# Patient Record
Sex: Female | Born: 1945 | Race: Black or African American | Hispanic: No | Marital: Married | Smoking: Never smoker
Health system: Southern US, Community
[De-identification: ages and names within clinical notes are randomized; demographics above are authoritative.]

## PROBLEM LIST (undated history)

## (undated) HISTORY — PX: BREAST EXCISIONAL BIOPSY: SUR124

---

## 1998-08-17 ENCOUNTER — Other Ambulatory Visit: Admission: RE | Admit: 1998-08-17 | Discharge: 1998-08-17 | Payer: Self-pay | Admitting: *Deleted

## 1998-09-28 ENCOUNTER — Encounter: Payer: Self-pay | Admitting: *Deleted

## 1998-09-28 ENCOUNTER — Ambulatory Visit (HOSPITAL_COMMUNITY): Admission: RE | Admit: 1998-09-28 | Discharge: 1998-09-28 | Payer: Self-pay | Admitting: *Deleted

## 1999-08-24 ENCOUNTER — Other Ambulatory Visit: Admission: RE | Admit: 1999-08-24 | Discharge: 1999-08-24 | Payer: Self-pay | Admitting: *Deleted

## 1999-10-04 ENCOUNTER — Ambulatory Visit (HOSPITAL_COMMUNITY): Admission: RE | Admit: 1999-10-04 | Discharge: 1999-10-04 | Payer: Self-pay | Admitting: *Deleted

## 1999-10-04 ENCOUNTER — Encounter: Payer: Self-pay | Admitting: *Deleted

## 2000-09-05 ENCOUNTER — Other Ambulatory Visit: Admission: RE | Admit: 2000-09-05 | Discharge: 2000-09-05 | Payer: Self-pay | Admitting: *Deleted

## 2000-10-05 ENCOUNTER — Ambulatory Visit (HOSPITAL_COMMUNITY): Admission: RE | Admit: 2000-10-05 | Discharge: 2000-10-05 | Payer: Self-pay | Admitting: *Deleted

## 2000-10-05 ENCOUNTER — Encounter: Payer: Self-pay | Admitting: *Deleted

## 2001-10-29 ENCOUNTER — Encounter: Payer: Self-pay | Admitting: *Deleted

## 2001-10-29 ENCOUNTER — Ambulatory Visit (HOSPITAL_COMMUNITY): Admission: RE | Admit: 2001-10-29 | Discharge: 2001-10-29 | Payer: Self-pay | Admitting: *Deleted

## 2002-11-04 ENCOUNTER — Encounter: Payer: Self-pay | Admitting: *Deleted

## 2002-11-04 ENCOUNTER — Ambulatory Visit (HOSPITAL_COMMUNITY): Admission: RE | Admit: 2002-11-04 | Discharge: 2002-11-04 | Payer: Self-pay | Admitting: *Deleted

## 2003-12-01 ENCOUNTER — Ambulatory Visit (HOSPITAL_COMMUNITY): Admission: RE | Admit: 2003-12-01 | Discharge: 2003-12-01 | Payer: Self-pay | Admitting: Family Medicine

## 2004-01-05 ENCOUNTER — Ambulatory Visit (HOSPITAL_COMMUNITY): Admission: RE | Admit: 2004-01-05 | Discharge: 2004-01-05 | Payer: Self-pay | Admitting: Orthopedic Surgery

## 2004-01-05 ENCOUNTER — Ambulatory Visit (HOSPITAL_BASED_OUTPATIENT_CLINIC_OR_DEPARTMENT_OTHER): Admission: RE | Admit: 2004-01-05 | Discharge: 2004-01-05 | Payer: Self-pay | Admitting: Orthopedic Surgery

## 2004-07-25 ENCOUNTER — Ambulatory Visit (HOSPITAL_COMMUNITY): Payer: Self-pay | Admitting: Neurology

## 2004-12-09 ENCOUNTER — Ambulatory Visit (HOSPITAL_COMMUNITY): Admission: RE | Admit: 2004-12-09 | Discharge: 2004-12-09 | Payer: Self-pay

## 2005-02-06 ENCOUNTER — Other Ambulatory Visit: Admission: RE | Admit: 2005-02-06 | Discharge: 2005-02-06 | Payer: Self-pay | Admitting: Family Medicine

## 2005-08-07 ENCOUNTER — Encounter: Admission: RE | Admit: 2005-08-07 | Discharge: 2005-08-07 | Payer: Self-pay | Admitting: Family Medicine

## 2006-01-03 ENCOUNTER — Ambulatory Visit (HOSPITAL_COMMUNITY): Admission: RE | Admit: 2006-01-03 | Discharge: 2006-01-03 | Payer: Self-pay | Admitting: Family Medicine

## 2006-05-18 ENCOUNTER — Other Ambulatory Visit: Admission: RE | Admit: 2006-05-18 | Discharge: 2006-05-18 | Payer: Self-pay | Admitting: Family Medicine

## 2007-01-07 ENCOUNTER — Ambulatory Visit (HOSPITAL_COMMUNITY): Admission: RE | Admit: 2007-01-07 | Discharge: 2007-01-07 | Payer: Self-pay | Admitting: Family Medicine

## 2007-07-05 ENCOUNTER — Other Ambulatory Visit: Admission: RE | Admit: 2007-07-05 | Discharge: 2007-07-05 | Payer: Self-pay | Admitting: Family Medicine

## 2008-01-14 ENCOUNTER — Ambulatory Visit (HOSPITAL_COMMUNITY): Admission: RE | Admit: 2008-01-14 | Discharge: 2008-01-14 | Payer: Self-pay | Admitting: Family Medicine

## 2008-07-28 ENCOUNTER — Other Ambulatory Visit: Admission: RE | Admit: 2008-07-28 | Discharge: 2008-07-28 | Payer: Self-pay | Admitting: Family Medicine

## 2008-11-28 ENCOUNTER — Emergency Department (HOSPITAL_COMMUNITY): Admission: EM | Admit: 2008-11-28 | Discharge: 2008-11-29 | Payer: Self-pay | Admitting: Emergency Medicine

## 2009-07-13 ENCOUNTER — Ambulatory Visit (HOSPITAL_COMMUNITY): Admission: RE | Admit: 2009-07-13 | Discharge: 2009-07-13 | Payer: Self-pay | Admitting: Family Medicine

## 2009-08-06 ENCOUNTER — Other Ambulatory Visit: Admission: RE | Admit: 2009-08-06 | Discharge: 2009-08-06 | Payer: Self-pay | Admitting: Family Medicine

## 2010-07-15 ENCOUNTER — Ambulatory Visit (HOSPITAL_COMMUNITY): Admission: RE | Admit: 2010-07-15 | Discharge: 2010-07-15 | Payer: Self-pay | Admitting: Family Medicine

## 2010-09-18 ENCOUNTER — Encounter: Payer: Self-pay | Admitting: Family Medicine

## 2010-11-25 ENCOUNTER — Other Ambulatory Visit (HOSPITAL_COMMUNITY)
Admission: RE | Admit: 2010-11-25 | Discharge: 2010-11-25 | Disposition: A | Discharge status: Home / Self Care | Payer: BC Managed Care – PPO | Source: Ambulatory Visit | Attending: Family Medicine | Admitting: Family Medicine

## 2010-11-25 ENCOUNTER — Other Ambulatory Visit: Payer: Self-pay | Admitting: Family Medicine

## 2010-11-25 DIAGNOSIS — Z124 Encounter for screening for malignant neoplasm of cervix: Secondary | ICD-10-CM | POA: Insufficient documentation

## 2010-12-07 LAB — DIFFERENTIAL
Basophils Relative: 1 % (ref 0–1)
Eosinophils Absolute: 0.1 10*3/uL (ref 0.0–0.7)
Monocytes Relative: 6 % (ref 3–12)
Neutro Abs: 4.7 10*3/uL (ref 1.7–7.7)

## 2010-12-07 LAB — BODY FLUID CELL COUNT WITH DIFFERENTIAL: Total Nucleated Cell Count, Fluid: 690 cu mm (ref 0–1000)

## 2010-12-07 LAB — POCT I-STAT, CHEM 8
Chloride: 101 mEq/L (ref 96–112)
Creatinine, Ser: 1 mg/dL (ref 0.4–1.2)
Glucose, Bld: 86 mg/dL (ref 70–99)
Potassium: 3.6 mEq/L (ref 3.5–5.1)
Sodium: 137 mEq/L (ref 135–145)

## 2010-12-07 LAB — CBC
MCHC: 34.8 g/dL (ref 30.0–36.0)
MCV: 88.3 fL (ref 78.0–100.0)

## 2010-12-07 LAB — BODY FLUID CULTURE

## 2010-12-07 LAB — GRAM STAIN

## 2011-01-13 NOTE — Op Note (Signed)
NAME:  Stacey Hull, Stacey Hull                    ACCOUNT NO.:  [id number]   MEDICAL RECORD NO.:  [id number]                   PATIENT TYPE:  AMB   LOCATION:  DSC                                  FACILITY:  [facility]   PHYSICIAN:  Feliberto Gottron. Turner Daniels, M.D.                DATE OF BIRTH:  11/03/1945   DATE OF PROCEDURE:  01/05/2004  DATE OF DISCHARGE:                                 OPERATIVE REPORT   PREOPERATIVE DIAGNOSIS:  Left ankle trimalleolar fracture.   POSTOPERATIVE DIAGNOSIS:  Left ankle trimalleolar fracture.   PROCEDURE:  Open reduction and internal fixation of left ankle trimalleolar  fracture using the Dupuy titanium small fragment set, six-hole lateral 1/3  tubular plate, +2 interfrag screws, medially a single 4 mm cancellous lag  screw, and then anterior to posterior 4 mm cancellous lag screw for the  posterior malleolar fracture.   SURGEON:  Feliberto Gottron. Turner Daniels, M.D.   ASSISTANTLaural Benes. Jannet Mantis.   ANESTHESIA:  General endotracheal.   ESTIMATED BLOOD LOSS:  Minimal.   FLUIDS REPLACED:  1 liter of crystalloid.   DRAINS:  None.   TOURNIQUET TIME:  50 minutes.   INDICATIONS FOR PROCEDURE:  A 65 year old woman who fell at home on Saturday  and sustained a trimalleolar fracture dislocation of her left ankle.  Was  seen in an urgent care center, x-rays obtained.  She was called about the x-  rays on Monday and was told that she had displaced bimalleolar fracture and  was told to obtain orthopedic consultation which she did on the morning of  Tuesday, Jan 05, 2004.  We reviewed the x-rays.  There was a trimalleolar  fracture dislocation and she was taken for open reduction and internal  fixation of the fracture dislocation.  The risks and benefits of surgery  were understood by the patient.   DESCRIPTION OF PROCEDURE:  The patient was identified by armband, taken to  the operating room at [facility].  Appropriate anesthetic  monitors were attached and general  LMA anesthesia induced with the patient  in the supine position.  A tourniquet was applied high to the left calf.  The left lower extremity prepped and draped in the usual sterile fashion.  From the toes to the tourniquet, the limb was wrapped with an Esmarch  bandage and the tourniquet inflated to 300 mmHg and we began the procedure  by making a lateral longitudinal incision starting out at the tip of the  lateral malleolus and going proximal for about 10 cm through the skin and  subcutaneous tissue.  Small bleeders in the skin and subcutaneous tissue  identified and cauterized.  We cut down to the fibula and the fracture site  itself being careful to avoid any significant branches of the superficial,  peroneal, or sural nerve.  The periosteum was reflected anteriorly and  posteriorly exposing the long spiral fracture which was reduced after  cleansing  the fracture site with normal saline irrigation solution and a  small curet.  Reduction was held with a long sterile clamp and two anterior  to posterior 3.5 mm cortical lag screws were placed in the distal fibula  followed by a six-hole 1/3 tubular neutralization plate with three  bicortical screws and two distal cancellous screws were only unicortical.  The reduction was anatomic.  C-arm images were taken confirming the  reduction.  We then made a 4 cm medial incision started just 1 cm distal to  the tip of the medial malleolus and going proximal for 3 to 4 cm and again  small bleeders in the skin and subcutaneous tissue identified and  cauterized.  We cut down to the periosteum over the medial malleolus  fracture site and exposed the fracture.  This was relatively easily reduced  with a towel clip and then a single 4 mm fully threaded cancellous lag screw  was used to fix the medial malleolus with the screw placed under C-arm image  control.  Posterior malleolus which involved about 25 to 30% of the tibial  plafond was almost anatomically  reduced at this point and with this in mind,  we went ahead and percutaneously placed an anterior to posterior 4 mm lag  screw to hold this reduction which was anatomic by the time we finished  placing the lag screw.  At this point, all screws were tightened to make  sure the fixation was firm.  Final c-arm images were taken of the  trimalleolar fixation and the wounds were washed out with normal saline  solution and the tourniquet let down.  Bleeders once again identified and  cauterized.  The subcutaneous tissue to both the major wounds was closed  with 3-0 Vicryl suture, the skin with running interlocking 3-0 nylon suture,  the anterior percutaneous screw placement wound was closed with single 3-0  nylon loop.  Dressing of Xeroform, 4x4 dressing, sponges, Webril, and Ace  wrap and the Cam Walker boot were then reapplied.  The patient was awakened  and taken to the recovery room.  She did receive 1 gram of Ancef  intraoperatively.                                               Feliberto Gottron. Turner Daniels, M.D.    Ovid Curd  D:  01/05/2004  T:  01/06/2004  Job:  [id number]

## 2011-06-16 ENCOUNTER — Other Ambulatory Visit (HOSPITAL_COMMUNITY): Payer: Self-pay | Admitting: Family Medicine

## 2011-06-16 DIAGNOSIS — Z1231 Encounter for screening mammogram for malignant neoplasm of breast: Secondary | ICD-10-CM

## 2011-07-18 ENCOUNTER — Ambulatory Visit (HOSPITAL_COMMUNITY)
Admission: RE | Admit: 2011-07-18 | Discharge: 2011-07-18 | Disposition: A | Discharge status: Home / Self Care | Payer: BC Managed Care – PPO | Source: Ambulatory Visit | Attending: Family Medicine | Admitting: Family Medicine

## 2011-07-18 DIAGNOSIS — Z1231 Encounter for screening mammogram for malignant neoplasm of breast: Secondary | ICD-10-CM

## 2011-12-05 ENCOUNTER — Other Ambulatory Visit: Payer: Self-pay | Admitting: Family Medicine

## 2011-12-05 ENCOUNTER — Other Ambulatory Visit (HOSPITAL_COMMUNITY)
Admission: RE | Admit: 2011-12-05 | Discharge: 2011-12-05 | Disposition: A | Discharge status: Home / Self Care | Payer: BC Managed Care – PPO | Source: Ambulatory Visit | Attending: Family Medicine | Admitting: Family Medicine

## 2011-12-05 DIAGNOSIS — Z124 Encounter for screening for malignant neoplasm of cervix: Secondary | ICD-10-CM | POA: Insufficient documentation

## 2012-06-19 DIAGNOSIS — Z23 Encounter for immunization: Secondary | ICD-10-CM | POA: Diagnosis not present

## 2012-09-12 ENCOUNTER — Other Ambulatory Visit (HOSPITAL_COMMUNITY): Payer: Self-pay | Admitting: Family Medicine

## 2012-09-12 DIAGNOSIS — Z1231 Encounter for screening mammogram for malignant neoplasm of breast: Secondary | ICD-10-CM

## 2012-09-20 ENCOUNTER — Ambulatory Visit (HOSPITAL_COMMUNITY)
Admission: RE | Admit: 2012-09-20 | Discharge: 2012-09-20 | Disposition: A | Discharge status: Home / Self Care | Payer: Medicare Other | Source: Ambulatory Visit | Attending: Family Medicine | Admitting: Family Medicine

## 2012-09-20 DIAGNOSIS — R5381 Other malaise: Secondary | ICD-10-CM | POA: Diagnosis not present

## 2012-09-20 DIAGNOSIS — M62838 Other muscle spasm: Secondary | ICD-10-CM | POA: Diagnosis not present

## 2012-09-20 DIAGNOSIS — Z1231 Encounter for screening mammogram for malignant neoplasm of breast: Secondary | ICD-10-CM | POA: Insufficient documentation

## 2012-09-20 DIAGNOSIS — F329 Major depressive disorder, single episode, unspecified: Secondary | ICD-10-CM | POA: Diagnosis not present

## 2012-09-24 ENCOUNTER — Ambulatory Visit: Payer: BC Managed Care – PPO | Admitting: Physical Therapy

## 2012-10-08 DIAGNOSIS — H52229 Regular astigmatism, unspecified eye: Secondary | ICD-10-CM | POA: Diagnosis not present

## 2012-10-08 DIAGNOSIS — H524 Presbyopia: Secondary | ICD-10-CM | POA: Diagnosis not present

## 2012-10-08 DIAGNOSIS — H25019 Cortical age-related cataract, unspecified eye: Secondary | ICD-10-CM | POA: Diagnosis not present

## 2012-10-08 DIAGNOSIS — H5231 Anisometropia: Secondary | ICD-10-CM | POA: Diagnosis not present

## 2012-12-18 ENCOUNTER — Other Ambulatory Visit: Payer: Self-pay | Admitting: Family Medicine

## 2012-12-18 ENCOUNTER — Other Ambulatory Visit (HOSPITAL_COMMUNITY)
Admission: RE | Admit: 2012-12-18 | Discharge: 2012-12-18 | Disposition: A | Discharge status: Home / Self Care | Payer: Medicare Other | Source: Ambulatory Visit | Attending: Family Medicine | Admitting: Family Medicine

## 2012-12-18 DIAGNOSIS — M899 Disorder of bone, unspecified: Secondary | ICD-10-CM | POA: Diagnosis not present

## 2012-12-18 DIAGNOSIS — Z124 Encounter for screening for malignant neoplasm of cervix: Secondary | ICD-10-CM | POA: Insufficient documentation

## 2012-12-18 DIAGNOSIS — Z8541 Personal history of malignant neoplasm of cervix uteri: Secondary | ICD-10-CM | POA: Diagnosis not present

## 2012-12-18 DIAGNOSIS — I10 Essential (primary) hypertension: Secondary | ICD-10-CM | POA: Diagnosis not present

## 2012-12-18 DIAGNOSIS — E7800 Pure hypercholesterolemia, unspecified: Secondary | ICD-10-CM | POA: Diagnosis not present

## 2012-12-18 DIAGNOSIS — M949 Disorder of cartilage, unspecified: Secondary | ICD-10-CM | POA: Diagnosis not present

## 2012-12-18 DIAGNOSIS — F329 Major depressive disorder, single episode, unspecified: Secondary | ICD-10-CM | POA: Diagnosis not present

## 2012-12-18 DIAGNOSIS — R5383 Other fatigue: Secondary | ICD-10-CM | POA: Diagnosis not present

## 2012-12-18 DIAGNOSIS — Z0000 Encounter for general adult medical examination without abnormal findings: Secondary | ICD-10-CM | POA: Diagnosis not present

## 2012-12-18 DIAGNOSIS — Z79899 Other long term (current) drug therapy: Secondary | ICD-10-CM | POA: Diagnosis not present

## 2012-12-18 DIAGNOSIS — Z23 Encounter for immunization: Secondary | ICD-10-CM | POA: Diagnosis not present

## 2012-12-18 DIAGNOSIS — R5381 Other malaise: Secondary | ICD-10-CM | POA: Diagnosis not present

## 2013-02-03 DIAGNOSIS — M949 Disorder of cartilage, unspecified: Secondary | ICD-10-CM | POA: Diagnosis not present

## 2013-02-03 DIAGNOSIS — M899 Disorder of bone, unspecified: Secondary | ICD-10-CM | POA: Diagnosis not present

## 2013-03-27 DIAGNOSIS — L280 Lichen simplex chronicus: Secondary | ICD-10-CM | POA: Diagnosis not present

## 2013-03-27 DIAGNOSIS — Z85820 Personal history of malignant melanoma of skin: Secondary | ICD-10-CM | POA: Diagnosis not present

## 2013-03-27 DIAGNOSIS — L821 Other seborrheic keratosis: Secondary | ICD-10-CM | POA: Diagnosis not present

## 2013-06-18 DIAGNOSIS — B009 Herpesviral infection, unspecified: Secondary | ICD-10-CM | POA: Diagnosis not present

## 2013-06-18 DIAGNOSIS — Z23 Encounter for immunization: Secondary | ICD-10-CM | POA: Diagnosis not present

## 2013-06-18 DIAGNOSIS — I10 Essential (primary) hypertension: Secondary | ICD-10-CM | POA: Diagnosis not present

## 2013-06-18 DIAGNOSIS — F329 Major depressive disorder, single episode, unspecified: Secondary | ICD-10-CM | POA: Diagnosis not present

## 2013-06-18 DIAGNOSIS — J301 Allergic rhinitis due to pollen: Secondary | ICD-10-CM | POA: Diagnosis not present

## 2013-06-18 DIAGNOSIS — G4700 Insomnia, unspecified: Secondary | ICD-10-CM | POA: Diagnosis not present

## 2013-07-14 DIAGNOSIS — M62838 Other muscle spasm: Secondary | ICD-10-CM | POA: Diagnosis not present

## 2013-07-14 DIAGNOSIS — M25559 Pain in unspecified hip: Secondary | ICD-10-CM | POA: Diagnosis not present

## 2013-07-14 DIAGNOSIS — S335XXA Sprain of ligaments of lumbar spine, initial encounter: Secondary | ICD-10-CM | POA: Diagnosis not present

## 2013-09-03 DIAGNOSIS — R209 Unspecified disturbances of skin sensation: Secondary | ICD-10-CM | POA: Diagnosis not present

## 2013-09-11 ENCOUNTER — Other Ambulatory Visit: Payer: Self-pay | Admitting: Family Medicine

## 2013-09-11 ENCOUNTER — Ambulatory Visit
Admission: RE | Admit: 2013-09-11 | Discharge: 2013-09-11 | Disposition: A | Discharge status: Home / Self Care | Payer: Medicare Other | Source: Ambulatory Visit | Attending: Family Medicine | Admitting: Family Medicine

## 2013-09-11 DIAGNOSIS — R209 Unspecified disturbances of skin sensation: Secondary | ICD-10-CM

## 2013-09-11 DIAGNOSIS — M5030 Other cervical disc degeneration, unspecified cervical region: Secondary | ICD-10-CM | POA: Diagnosis not present

## 2013-09-12 ENCOUNTER — Ambulatory Visit (INDEPENDENT_AMBULATORY_CARE_PROVIDER_SITE_OTHER): Payer: Self-pay

## 2013-09-12 ENCOUNTER — Encounter (INDEPENDENT_AMBULATORY_CARE_PROVIDER_SITE_OTHER): Payer: Self-pay

## 2013-09-12 ENCOUNTER — Ambulatory Visit (INDEPENDENT_AMBULATORY_CARE_PROVIDER_SITE_OTHER): Payer: Medicare Other | Admitting: Neurology

## 2013-09-12 DIAGNOSIS — R209 Unspecified disturbances of skin sensation: Secondary | ICD-10-CM

## 2013-09-12 DIAGNOSIS — R202 Paresthesia of skin: Secondary | ICD-10-CM | POA: Insufficient documentation

## 2013-09-12 NOTE — Procedures (Signed)
    [facility]  NCS (NERVE CONDUCTION STUDY) WITH EMG (ELECTROMYOGRAPHY) REPORT   STUDY DATE: Jan 16th 2015 PATIENT NAME: Stacey AlbeeJacqueline H Maffei DOB: 10/19/1945 MRN: [id number]    TECHNOLOGIST: Gearldine ShownLorraine Jones ELECTROMYOGRAPHER: Levert FeinsteinYan, Noralyn Karim M.D.  CLINICAL INFORMATION: 68 year old right-handed Caucasian female, presenting with intermittent year history of bilateral fingertips paresthesia, mainly involving the index, and middle fingers. She denies back pain.  On exam: Bilateral upper extremity motor strength is normal, sensory examination was normal to light touch and pinprick. Deep tendon reflexes were normal and symmetric   FINDINGS: NERVE CONDUCTION STUDY:  Bilateral median, ulnar mixed, sensory and motor responses were normal  NEEDLE ELECTROMYOGRAPHY: Selected needle examination was performed at right upper extremity muscles, and the right cervical paraspinal muscles.  Needle examination of right abductor pollicis brevis, pronator teres, biceps, triceps, deltoid was normal  There was no spontaneous activity at right cervical paraspinal muscles, right C5, C6, C7   IMPRESSION:  This is a normal study. There is no electrodiagnostic evidence of right upper extremity neuropathy, or right cervical radiculopathy   INTERPRETING PHYSICIAN:   Levert FeinsteinYan, Jannat Rosemeyer M.D. Ph.D. [facility] [address], [address] [place], [place] [address] [phone]

## 2013-10-28 ENCOUNTER — Other Ambulatory Visit (HOSPITAL_COMMUNITY): Payer: Self-pay | Admitting: Family Medicine

## 2013-10-28 DIAGNOSIS — Z1231 Encounter for screening mammogram for malignant neoplasm of breast: Secondary | ICD-10-CM

## 2013-11-04 ENCOUNTER — Ambulatory Visit (HOSPITAL_COMMUNITY)
Admission: RE | Admit: 2013-11-04 | Discharge: 2013-11-04 | Disposition: A | Discharge status: Home / Self Care | Payer: Medicare Other | Source: Ambulatory Visit | Attending: Family Medicine | Admitting: Family Medicine

## 2013-11-04 DIAGNOSIS — Z1231 Encounter for screening mammogram for malignant neoplasm of breast: Secondary | ICD-10-CM | POA: Diagnosis not present

## 2013-11-05 ENCOUNTER — Encounter: Payer: Self-pay | Admitting: Diagnostic Neuroimaging

## 2013-11-05 ENCOUNTER — Ambulatory Visit (INDEPENDENT_AMBULATORY_CARE_PROVIDER_SITE_OTHER): Payer: Medicare Other | Admitting: Diagnostic Neuroimaging

## 2013-11-05 ENCOUNTER — Encounter (INDEPENDENT_AMBULATORY_CARE_PROVIDER_SITE_OTHER): Payer: Self-pay

## 2013-11-05 VITALS — BP 134/81 | HR 82 | Temp 98.3°F | Ht 62.0 in | Wt 169.0 lb

## 2013-11-05 DIAGNOSIS — R209 Unspecified disturbances of skin sensation: Secondary | ICD-10-CM

## 2013-11-05 DIAGNOSIS — R200 Anesthesia of skin: Secondary | ICD-10-CM | POA: Insufficient documentation

## 2013-11-05 DIAGNOSIS — I7300 Raynaud's syndrome without gangrene: Secondary | ICD-10-CM | POA: Diagnosis not present

## 2013-11-05 NOTE — Patient Instructions (Signed)
Follow up with PCP or rheumatology.

## 2013-11-05 NOTE — Progress Notes (Signed)
[facility]  PATIENT: Stacey Hull DOB: [birth year]  REFERRING CLINICIAN: Andee Poles HISTORY FROM: patient  REASON FOR VISIT: new consult   HISTORICAL  CHIEF COMPLAINT:  Chief Complaint  Patient presents with  . Numbness    hand and foot, fingers    HISTORY OF PRESENT ILLNESS:   68 year old right-handed female with depression, anxiety, here for evaluation of numbness in fingers and toes.  For past one year patient has had intermittent numbness in her fingers, 2-3 times per day, lasting up to 2 minutes at a time. This is triggered typically by cold temperature exposure, followed by pale white appearance in her fingertips, followed by transition to dusky blue color. Patient typically massages her fingers, warms them up, and her fingers returned back to normal. Sometimes this affects her toes as well.  Patient had EMG study which was normal.  REVIEW OF SYSTEMS: Full 14 system review of systems performed and notable only for numbness depression anxiety decreased energy feeling hot easy bruising fatigue.  ALLERGIES: No Known Allergies  HOME MEDICATIONS: No outpatient prescriptions prior to visit.   No facility-administered medications prior to visit.    PAST MEDICAL HISTORY: No past medical history on file.  PAST SURGICAL HISTORY: No past surgical history on file.  FAMILY HISTORY: Family History  Problem Relation Age of Onset  . Heart disease Father     SOCIAL HISTORY:  History   Social History  . Marital Status: Married    Spouse Name: Oneita Jolly    Number of Children: 1  . Years of Education: MA of Ed   Occupational History  . [employer]    Social History Main Topics  . Smoking status: Never Smoker   . Smokeless tobacco: Never Used  . Alcohol Use: Yes     Comment: 1-2 glasses of wine socially  . Drug Use: No  . Sexual Activity: Not on file   Other Topics Concern  . Not on file   Social History Narrative   Patient lives at home  with spouse.   Caffeine Use: 1-2 cups tea or coffee daily     PHYSICAL EXAM  Filed Vitals:   11/05/13 1045  BP: 134/81  Pulse: 82  Temp: 98.3 F (36.8 C)  TempSrc: Oral  Height: 5\' 2"  (1.575 m)  Weight: 169 lb (76.658 kg)    Not recorded    Body mass index is 30.9 kg/(m^2).  GENERAL EXAM: Patient is in no distress; well developed, nourished and groomed; neck is supple  CARDIOVASCULAR: Regular rate and rhythm, no murmurs, no carotid bruits  NEUROLOGIC: MENTAL STATUS: awake, alert, oriented to person, place and time, recent and remote memory intact, normal attention and concentration, language fluent, comprehension intact, naming intact, fund of knowledge appropriate CRANIAL NERVE: no papilledema on fundoscopic exam, pupils equal and reactive to light, visual fields full to confrontation, extraocular muscles intact, no nystagmus, facial sensation and strength symmetric, hearing intact, palate elevates symmetrically, uvula midline, shoulder shrug symmetric, tongue midline. MOTOR: normal bulk and tone, full strength in the BUE, BLE SENSORY: normal and symmetric to light touch, pinprick, temperature, vibration COORDINATION: finger-nose-finger, fine finger movements normal REFLEXES: deep tendon reflexes present and symmetric; TRACE IN BLE. GAIT/STATION: narrow based gait; UNSTEADY TANDEM. Romberg is negative    DIAGNOSTIC DATA (LABS, IMAGING, TESTING) - I reviewed patient records, labs, notes, testing and imaging myself where available.  Lab Results  Component Value Date   WBC 7.5 11/29/2008   HGB 13.6 11/29/2008   HCT  40.0 11/29/2008   MCV 88.3 11/29/2008   PLT 176 11/29/2008      Component Value Date/Time   NA 137 11/29/2008 0130   K 3.6 11/29/2008 0130   CL 101 11/29/2008 0130   GLUCOSE 86 11/29/2008 0130   BUN 9 11/29/2008 0130   CREATININE 1.0 11/29/2008 0130   No results found for this basename: CHOL, HDL, LDLCALC, LDLDIRECT, TRIG, CHOLHDL   No results found for this basename:  HGBA1C   No results found for this basename: VITAMINB12   No results found for this basename: TSH    09/12/13 NCS/EMG - NORMAL   ASSESSMENT AND PLAN  68 y.o. year old female here with intermittent, transient numbness in toes and fingers, typically triggered by cold temperature exposure, followed by color change in the extremities (white, blue, red). Signs and symptoms are very typical for raynaud's phenomenon. No evidence for neuropathy or primary neurologic syndrome.  Dx: Raynaud's phenomenon  PLAN: - follow up with PCP; consider rheumatology workup  Return if symptoms worsen or fail to improve, for return to PCP.    Suanne MarkerVIKRAM R. Linwood Gullikson, MD 11/05/2013, 11:56 AM Certified in Neurology, Neurophysiology and Neuroimaging  [facility] [address], [address] [place], [place] [address] [phone]

## 2013-11-27 DIAGNOSIS — H524 Presbyopia: Secondary | ICD-10-CM | POA: Diagnosis not present

## 2013-11-27 DIAGNOSIS — H2510 Age-related nuclear cataract, unspecified eye: Secondary | ICD-10-CM | POA: Diagnosis not present

## 2013-11-27 DIAGNOSIS — H52229 Regular astigmatism, unspecified eye: Secondary | ICD-10-CM | POA: Diagnosis not present

## 2013-11-27 DIAGNOSIS — H5210 Myopia, unspecified eye: Secondary | ICD-10-CM | POA: Diagnosis not present

## 2013-12-30 DIAGNOSIS — I7300 Raynaud's syndrome without gangrene: Secondary | ICD-10-CM | POA: Diagnosis not present

## 2013-12-30 DIAGNOSIS — M359 Systemic involvement of connective tissue, unspecified: Secondary | ICD-10-CM | POA: Diagnosis not present

## 2013-12-31 DIAGNOSIS — M359 Systemic involvement of connective tissue, unspecified: Secondary | ICD-10-CM | POA: Diagnosis not present

## 2014-01-09 DIAGNOSIS — I10 Essential (primary) hypertension: Secondary | ICD-10-CM | POA: Diagnosis not present

## 2014-01-09 DIAGNOSIS — Z0000 Encounter for general adult medical examination without abnormal findings: Secondary | ICD-10-CM | POA: Diagnosis not present

## 2014-01-09 DIAGNOSIS — F329 Major depressive disorder, single episode, unspecified: Secondary | ICD-10-CM | POA: Diagnosis not present

## 2014-01-09 DIAGNOSIS — F3289 Other specified depressive episodes: Secondary | ICD-10-CM | POA: Diagnosis not present

## 2014-01-09 DIAGNOSIS — B009 Herpesviral infection, unspecified: Secondary | ICD-10-CM | POA: Diagnosis not present

## 2014-01-09 DIAGNOSIS — Z8541 Personal history of malignant neoplasm of cervix uteri: Secondary | ICD-10-CM | POA: Diagnosis not present

## 2014-01-09 DIAGNOSIS — E7800 Pure hypercholesterolemia, unspecified: Secondary | ICD-10-CM | POA: Diagnosis not present

## 2014-01-09 DIAGNOSIS — Z23 Encounter for immunization: Secondary | ICD-10-CM | POA: Diagnosis not present

## 2014-02-18 DIAGNOSIS — Z713 Dietary counseling and surveillance: Secondary | ICD-10-CM | POA: Diagnosis not present

## 2014-02-18 DIAGNOSIS — Z6832 Body mass index (BMI) 32.0-32.9, adult: Secondary | ICD-10-CM | POA: Diagnosis not present

## 2014-02-18 DIAGNOSIS — E669 Obesity, unspecified: Secondary | ICD-10-CM | POA: Diagnosis not present

## 2014-03-24 DIAGNOSIS — Z6832 Body mass index (BMI) 32.0-32.9, adult: Secondary | ICD-10-CM | POA: Diagnosis not present

## 2014-03-24 DIAGNOSIS — Z713 Dietary counseling and surveillance: Secondary | ICD-10-CM | POA: Diagnosis not present

## 2014-03-24 DIAGNOSIS — I10 Essential (primary) hypertension: Secondary | ICD-10-CM | POA: Diagnosis not present

## 2014-03-24 DIAGNOSIS — E669 Obesity, unspecified: Secondary | ICD-10-CM | POA: Diagnosis not present

## 2014-06-11 DIAGNOSIS — Z23 Encounter for immunization: Secondary | ICD-10-CM | POA: Diagnosis not present

## 2014-07-01 DIAGNOSIS — Z6832 Body mass index (BMI) 32.0-32.9, adult: Secondary | ICD-10-CM | POA: Diagnosis not present

## 2014-07-01 DIAGNOSIS — B009 Herpesviral infection, unspecified: Secondary | ICD-10-CM | POA: Diagnosis not present

## 2014-07-01 DIAGNOSIS — E669 Obesity, unspecified: Secondary | ICD-10-CM | POA: Diagnosis not present

## 2014-07-01 DIAGNOSIS — T50Z95A Adverse effect of other vaccines and biological substances, initial encounter: Secondary | ICD-10-CM | POA: Diagnosis not present

## 2014-08-03 DIAGNOSIS — I7300 Raynaud's syndrome without gangrene: Secondary | ICD-10-CM | POA: Diagnosis not present

## 2014-08-03 DIAGNOSIS — R768 Other specified abnormal immunological findings in serum: Secondary | ICD-10-CM | POA: Diagnosis not present

## 2014-08-25 DIAGNOSIS — M79606 Pain in leg, unspecified: Secondary | ICD-10-CM | POA: Diagnosis not present

## 2014-09-04 DIAGNOSIS — M25559 Pain in unspecified hip: Secondary | ICD-10-CM | POA: Diagnosis not present

## 2014-09-07 ENCOUNTER — Other Ambulatory Visit: Payer: Self-pay | Admitting: Family Medicine

## 2014-09-07 ENCOUNTER — Ambulatory Visit
Admission: RE | Admit: 2014-09-07 | Discharge: 2014-09-07 | Disposition: A | Discharge status: Home / Self Care | Payer: Medicare Other | Source: Ambulatory Visit | Attending: Family Medicine | Admitting: Family Medicine

## 2014-09-07 DIAGNOSIS — M25551 Pain in right hip: Secondary | ICD-10-CM

## 2014-09-23 DIAGNOSIS — M79669 Pain in unspecified lower leg: Secondary | ICD-10-CM | POA: Diagnosis not present

## 2014-09-24 ENCOUNTER — Other Ambulatory Visit: Payer: Self-pay | Admitting: Family Medicine

## 2014-09-24 ENCOUNTER — Ambulatory Visit
Admission: RE | Admit: 2014-09-24 | Discharge: 2014-09-24 | Disposition: A | Discharge status: Home / Self Care | Payer: Medicare Other | Source: Ambulatory Visit | Attending: Family Medicine | Admitting: Family Medicine

## 2014-09-24 DIAGNOSIS — M25571 Pain in right ankle and joints of right foot: Secondary | ICD-10-CM | POA: Diagnosis not present

## 2014-09-24 DIAGNOSIS — M79661 Pain in right lower leg: Secondary | ICD-10-CM

## 2014-10-07 ENCOUNTER — Ambulatory Visit: Payer: Medicare Other | Attending: Family Medicine | Admitting: Physical Therapy

## 2014-10-07 DIAGNOSIS — M79604 Pain in right leg: Secondary | ICD-10-CM | POA: Diagnosis not present

## 2014-10-12 ENCOUNTER — Ambulatory Visit: Payer: Medicare Other | Admitting: Physical Therapy

## 2014-10-15 ENCOUNTER — Encounter: Payer: Self-pay | Admitting: Physical Therapy

## 2014-10-15 ENCOUNTER — Ambulatory Visit: Payer: Medicare Other | Admitting: Physical Therapy

## 2014-10-15 DIAGNOSIS — M79661 Pain in right lower leg: Secondary | ICD-10-CM

## 2014-10-15 DIAGNOSIS — M79604 Pain in right leg: Secondary | ICD-10-CM | POA: Diagnosis not present

## 2014-10-15 NOTE — Patient Instructions (Addendum)
Inversion: Resisted   Cross legs with right leg underneath, foot in tubing loop. Hold tubing around other foot to resist and turn foot in. Repeat _10___ times per set. Do _1__ sets per session. Do ___1_ sessions per day.  http://orth.exer.us/12   Copyright  VHI. All rights reserved.  Eversion: Resisted   With right foot in tubing loop, hold tubing around other foot to resist and turn foot out. Repeat __10__ times per set. Do __1_ _ sets per session. Do __1__ sessions per day.  http://orth.exer.us/14   Copyright  VHI. All rights reserved.  Plantar Flexion: Resisted   Anchor behind, tubing around left foot, press down. Repeat _30___ times per set. Do __1__ sets per session. Do _1___ sessions per day.  http://orth.exer.us/10   Copyright  VHI. All rights reserved.  Dorsiflexion: Resisted Copyright  VHI. All rights reserved.  IONTOPHORESIS PATIENT PRECAUTIONS & CONTRAINDICATIONS:  . Redness under one or both electrodes can occur.  This characterized by a uniform redness that usually disappears within 12 hours of treatment. . Small pinhead size blisters may result in response to the drug.  Contact your physician if the problem persists more than 24 hours. . On rare occasions, iontophoresis therapy can result in temporary skin reactions such as rash, inflammation, irritation or burns.  The skin reactions may be the result of individual sensitivity to the ionic solution used, the condition of the skin at the start of treatment, reaction to the materials in the electrodes, allergies or sensitivity to dexamethasone, or a poor connection between the patch and your skin.  Discontinue using iontophoresis if you have any of these reactions and report to your therapist. . Remove the Patch or electrodes if you have any undue sensation of pain or burning during the treatment and report discomfort to your therapist. . Tell your Therapist if you have had known adverse reactions to the  application of electrical current. . If using the Patch, the LED light will turn off when treatment is complete and the patch can be removed.  Approximate treatment time is 1-3 hours.  Remove the patch when light goes off or after 6 hours. . The Patch can be worn during normal activity, however excessive motion where the electrodes have been placed can cause poor contact between the skin and the electrode or uneven electrical current resulting in greater risk of skin irritation. Marland Kitchen. Keep out of the reach of children.   . DO NOT use if you have a cardiac pacemaker or any other electrically sensitive implanted device. . DO NOT use if you have a known sensitivity to dexamethasone. . DO NOT use during Magnetic Resonance Imaging (MRI). . DO NOT use over broken or compromised skin (e.g. sunburn, cuts, or acne) due to the increased risk of skin reaction. . DO NOT SHAVE over the area to be treated:  To establish good contact between the Patch and the skin, excessive hair may be clipped. . DO NOT place the Patch or electrodes on or over your eyes, directly over your heart, or brain. . DO NOT reuse the Patch or electrodes as this may cause burns to occur.

## 2014-10-15 NOTE — Therapy (Signed)
[facility] [address] [place], [address] [place], [place], [address] Phone: [phone]   Fax:  [phone]  Physical Therapy Treatment  Patient Details  Name: Stacey Hull MRN: [id number] Date of Birth: December 14, 1945 Referring Provider:  Frederich Chick, MD  Encounter Date: 10/15/2014      PT End of Session - 10/15/14 1456    Visit Number 2   Date for PT Re-Evaluation 11/27/14   PT Start Time 1400   PT Stop Time 1455   PT Time Calculation (min) 55 min   Activity Tolerance Patient tolerated treatment well   Behavior During Therapy [employer] [facility] [employer] for tasks assessed/performed      History reviewed. No pertinent past medical history.  History reviewed. No pertinent past surgical history.  There were no vitals taken for this visit.  Visit Diagnosis:  Pain of right lower leg      Subjective Assessment - 10/15/14 1409    Symptoms Pain in right ankle and hip making it difficulty with walking and performing daily activities.   Limitations Standing   How long can you stand comfortably? stand for 40-45 minutes   How long can you walk comfortably? walk but has the nagging pain   Patient Stated Goals reduce pain in right ankle and hip to walk and stand.    Currently in Pain? Yes   Pain Score 8    Pain Location Ankle   Pain Orientation Right   Pain Descriptors / Indicators Aching;Nagging   Pain Type Chronic pain   Pain Radiating Towards radiate to right hip   Pain Onset --  06/2014   Pain Frequency Constant   Aggravating Factors  walking and standing   Pain Relieving Factors rest    Effect of Pain on Daily Activities difficulty with walking and standing   Multiple Pain Sites No                    [facility] Adult PT Treatment/Exercise - 10/15/14 0001    Iontophoresis   Type of Iontophoresis Dexamethasone   Location medial right ankle   Dose 1ml   Time 6 hours   Manual Therapy   Massage soft tissue work to  right calf,  medial right ankle and foot   Ankle Exercises: Seated   BAPS Sitting;Level 2;15 reps   BAPS Weights (lbs) no weight all directions    BAPS Limitations verbal cues on control    Other Seated Ankle Exercises ankle red theraband for inv/eversion/plantarflexion 30x                PT Education - 10/15/14 1456    Education provided Yes   Education Details theraband ankle exercises   Person(s) Educated Patient   Methods Explanation;Demonstration   Comprehension Verbalized understanding;Returned demonstration          PT Short Term Goals - 10/15/14 1426    PT SHORT TERM GOAL #1   Title be independent with initial HEP for flexibility exercises   Time 4   Period Weeks   Status New   PT SHORT TERM GOAL #2   Title walk 15 minutes 2 times per week with mild pain   Time 4   Period Weeks   Status New   PT SHORT TERM GOAL #3   Title stand to cook a meal or household tasks for 15 minutes with mild to no pain   Time 4   Period Weeks   Status New   PT SHORT TERM GOAL #  5   Title pain in right hip and ankle decreased >/= 25%   Time 4   Period Weeks   Status New           PT Long Term Goals - 10/15/14 1428    PT LONG TERM GOAL #1   Title demonstrate and/or verbalize techniques to reduce the rils of re-injury to include info on anti -inflammatory   Time 8   Period Weeks   Status New   PT LONG TERM GOAL #2   Title be independent with advanced HEP    Time 8   Period Weeks   Status New   PT LONG TERM GOAL #3   Title in the morning go down stair with step over step pattern and mild to no pain   Time 8   Period Weeks   Status New   PT LONG TERM GOAL #4   Title walk 30 minutes 2 times per week   Time 8   Period Weeks   Status New   PT LONG TERM GOAL #5   Title stand to cook a meal or house hold tasks for 30 minutes with mild to no pain   Time 8   Period Weeks   Status New   Additional Long Term Goals   Additional Long Term Goals Yes   PT LONG TERM GOAL #6    Title pain in right hip and ankle decreased >/= 75%   Time 8   Period Weeks   Status New               Plan - 10/15/14 1457    Clinical Impression Statement Patient has tight calf, decreased strength of right ankle, decreased right ankle control   Pt will benefit from skilled therapeutic intervention in order to improve on the following deficits Decreased strength;Decreased mobility;Impaired flexibility;Pain   Rehab Potential Good   Clinical Impairments Affecting Rehab Potential None   PT Frequency 2x / week   PT Duration 8 weeks   PT Treatment/Interventions Patient/family education;Therapeutic exercise;Manual techniques  iontophoresis   PT Next Visit Plan knee and hip theraband exercises; see if ionto helped; soft tissue work   PT Home Exercise Plan knee theraband exercises   Consulted and Agree with Plan of Care Patient        Problem List Patient Active Problem List   Diagnosis Date Noted  . Raynaud's phenomenon 11/05/2013  . Numbness 11/05/2013  . Paresthesia 09/12/2013    Crandall Harvel, PT 10/15/2014, 3:04 PM  [facility] [address], [place] [address] [place], [place], [address] Phone: [phone]   Fax:  [phone]

## 2014-10-20 ENCOUNTER — Ambulatory Visit: Payer: Medicare Other

## 2014-10-20 DIAGNOSIS — M79661 Pain in right lower leg: Secondary | ICD-10-CM

## 2014-10-20 DIAGNOSIS — R29898 Other symptoms and signs involving the musculoskeletal system: Secondary | ICD-10-CM

## 2014-10-20 DIAGNOSIS — M79604 Pain in right leg: Secondary | ICD-10-CM | POA: Diagnosis not present

## 2014-10-20 NOTE — Patient Instructions (Signed)

## 2014-10-20 NOTE — Therapy (Signed)
[facility] [address], [address] [place], [place], [address] Phone: [phone]   Fax:  [phone]  Physical Therapy Treatment  Patient Details  Name: Stacey Hull MRN: [id number] Date of Birth: 01/01/1946 Referring Provider:  Frederich ChickWebb, Carol D, MD  Encounter Date: 10/20/2014      PT End of Session - 10/20/14 1442    Visit Number 3   Number of Visits --  10 Medicare   Date for PT Re-Evaluation 11/27/14   PT Start Time 1400   PT Stop Time 1446   PT Time Calculation (min) 46 min   Activity Tolerance Patient tolerated treatment well   Behavior During Therapy [facility] for tasks assessed/performed      History reviewed. No pertinent past medical history.  History reviewed. No pertinent past surgical history.  There were no vitals taken for this visit.  Visit Diagnosis:  Pain of right lower leg  Ankle weakness      Subjective Assessment - 10/20/14 1401    Symptoms Rt ankle is feeling better.     Currently in Pain? Yes   Pain Score 4    Pain Location Ankle   Pain Orientation Right   Pain Descriptors / Indicators Sore   Pain Type Chronic pain   Pain Onset More than a month ago   Pain Frequency Intermittent   Aggravating Factors  movement, standing and walking   Pain Relieving Factors unknown    Multiple Pain Sites Yes   Pain Score 7   Pain Type Chronic pain   Pain Descriptors / Indicators Shooting;Other (Comment)  brief   Pain Frequency Intermittent   Pain Onset With Activity          [facility] PT Assessment - 10/20/14 0001    Assessment   Medical Diagnosis Lower leg pain (M79.669)   Onset Date 06/28/14   Precautions   Precautions Other (comment)  No US- history of cancer   Precaution Comments no US-cancer history   Balance Screen   Has the patient fallen in the past 6 months No   Has the patient had a decrease in activity level because of a fear of falling?  No   Is the patient reluctant to leave  their home because of a fear of falling?  No   Home Environment   Living Enviornment Private residence   Prior Function   Level of Independence Independent with basic ADLs   Cognition   Overall Cognitive Status Within Functional Limits for tasks assessed                  [facility] Adult [facility] Treatment/Exercise - 10/20/14 0001    Knee/Hip Exercises: Aerobic   Stationary Bike Level 2 x 6 minutes  PT present to assess goals.Pt reported Rt groin pain @ 5min    Knee/Hip Exercises: Supine   Bridges Strengthening;Both;2 sets;10 reps   Iontophoresis   Type of Iontophoresis Dexamethasone   Location medial right ankle   Dose 1ml   Time 6 hours   Manual Therapy   Manual Therapy Other (comment)   Massage soft tissue elongation to Rt medial ankle and gastroc   Ankle Exercises: Standing   Other Standing Ankle Exercises mini tramp- weight shifting 3 ways x 1 minute   Ankle Exercises: Seated   BAPS Sitting;Level 2  4 ways- 20 reps.  Min to mod assistance required by PT   Other Seated Ankle Exercises ankle red theraband for inv/eversion/plantarflexion 30x  review of HEP- pt  with good return demo                PT Education - 10/20/14 1442    Education provided Yes   Education Details ionto   Person(s) Educated Patient   Methods Explanation   Comprehension Verbalized understanding          PT Short Term Goals - 10/20/14 1404    PT SHORT TERM GOAL #1   Title be independent with initial HEP for flexibility exercises   Time 4   Status On-going  independent in intial HEP   PT SHORT TERM GOAL #2   Title walk 15 minutes 2 times per week with mild pain   Time 4   Period Weeks   Status Achieved  no pain with level surface walking   PT SHORT TERM GOAL #3   Title stand to cook a meal or household tasks for 15 minutes with mild to no pain   Time 4   Period Weeks   Status Achieved  1-2/10 Rt ankle pain reported           PT Long Term Goals - 10/15/14 1428    PT LONG  TERM GOAL #1   Title demonstrate and/or verbalize techniques to reduce the rils of re-injury to include info on anti -inflammatory   Time 8   Period Weeks   Status New   PT LONG TERM GOAL #2   Title be independent with advanced HEP    Time 8   Period Weeks   Status New   PT LONG TERM GOAL #3   Title in the morning go down stair with step over step pattern and mild to no pain   Time 8   Period Weeks   Status New   PT LONG TERM GOAL #4   Title walk 30 minutes 2 times per week   Time 8   Period Weeks   Status New   PT LONG TERM GOAL #5   Title stand to cook a meal or house hold tasks for 30 minutes with mild to no pain   Time 8   Period Weeks   Status New   Additional Long Term Goals   Additional Long Term Goals Yes   PT LONG TERM GOAL #6   Title pain in right hip and ankle decreased >/= 75%   Time 8   Period Weeks   Status New               Plan - 10/20/14 1443    Clinical Impression Statement Pt reports 50% overall improvement since the start of care.  See goal assessment.     Pt will benefit from skilled therapeutic intervention in order to improve on the following deficits Decreased strength;Decreased mobility;Impaired flexibility;Pain   Rehab Potential Good   Clinical Impairments Affecting Rehab Potential None   PT Frequency 2x / week   PT Duration 8 weeks   PT Treatment/Interventions Patient/family education;Therapeutic exercise;Manual techniques;Other (comment)  iontophoresis   PT Next Visit Plan Continue Ionto, soft tissue work, ankle proprioception and strength   Consulted and Agree with Plan of Care Patient        Problem List Patient Active Problem List   Diagnosis Date Noted  . Raynaud's phenomenon 11/05/2013  . Numbness 11/05/2013  . Paresthesia 09/12/2013    TAKACS,KELLY, PT 10/20/2014, 2:45 PM  [facility] [address], [address] [place], [place], [address] Phone: [phone]   Fax:  [phone]      

## 2014-10-22 ENCOUNTER — Ambulatory Visit: Payer: Medicare Other

## 2014-10-22 DIAGNOSIS — M79604 Pain in right leg: Secondary | ICD-10-CM | POA: Diagnosis not present

## 2014-10-22 DIAGNOSIS — M79661 Pain in right lower leg: Secondary | ICD-10-CM

## 2014-10-22 DIAGNOSIS — R29898 Other symptoms and signs involving the musculoskeletal system: Secondary | ICD-10-CM

## 2014-10-22 NOTE — Therapy (Signed)
[facility] [address] [place], [place], [address] Phone: [phone]   Fax:  [phone]  Physical Therapy Treatment  Patient Details  Name: Stacey Hull MRN: [id number] Date of Birth: Sep 05, 1945 Referring Provider:  Frederich Chick, MD  Encounter Date: 10/22/2014      PT End of Session - 10/22/14 1436    Visit Number 4   Number of Visits --  10 Medicare   Date for PT Re-Evaluation 11/27/14   PT Start Time 1355   PT Stop Time 1437   PT Time Calculation (min) 42 min   Activity Tolerance Patient tolerated treatment well   Behavior During Therapy [facility] for tasks assessed/performed      History reviewed. No pertinent past medical history.  History reviewed. No pertinent past surgical history.  There were no vitals taken for this visit.  Visit Diagnosis:  Pain of right lower leg  Ankle weakness      Subjective Assessment - 10/22/14 1356    Symptoms No pain after new exercises last session   Currently in Pain? Yes   Pain Score 3    Pain Location Ankle   Pain Orientation Right   Pain Descriptors / Indicators Sore   Pain Type Chronic pain   Pain Onset More than a month ago                    [facility] Adult PT Treatment/Exercise - 10/22/14 0001    Knee/Hip Exercises: Aerobic   Stationary Bike Level 2 x 7 minutes  Rt groin pain at the end of this activity   Iontophoresis   Type of Iontophoresis Dexamethasone   Location medial right ankle   Dose 1ml   Time 6 hours   Manual Therapy   Manual Therapy Other (comment)   Massage soft tissue elongation and gentle friction to Rt medial calf and ankle.  Pt hypersensitive to light touch over proximal ankle invertors so manual ended early.   Ankle Exercises: Standing   SLS 5x10 seconds- added to HEP   Heel Raises 20 reps  Added to HEP   Other Standing Ankle Exercises mini tramp- weight shifting 3 ways x 1 minute   Ankle Exercises: Seated   Other Seated Ankle Exercises ankle red theraband for inv/eversion/plantarflexion 30x   Other Seated Ankle Exercises seated rockerboard: PF/DF and inv/ev x 2 minutes                PT Education - 10/22/14 1418    Education provided Yes   Education Details ionto, HEP for standing ankle strength   Person(s) Educated Patient   Methods Explanation;Demonstration;Handout   Comprehension Verbalized understanding          PT Short Term Goals - 10/20/14 1404    PT SHORT TERM GOAL #1   Title be independent with initial HEP for flexibility exercises   Time 4   Status On-going  independent in intial HEP   PT SHORT TERM GOAL #2   Title walk 15 minutes 2 times per week with mild pain   Time 4   Period Weeks   Status Achieved  no pain with level surface walking   PT SHORT TERM GOAL #3   Title stand to cook a meal or household tasks for 15 minutes with mild to no pain   Time 4   Period Weeks   Status Achieved  1-2/10 Rt ankle pain reported  PT Long Term Goals - 10/15/14 1428    PT LONG TERM GOAL #1   Title demonstrate and/or verbalize techniques to reduce the rils of re-injury to include info on anti -inflammatory   Time 8   Period Weeks   Status New   PT LONG TERM GOAL #2   Title be independent with advanced HEP    Time 8   Period Weeks   Status New   PT LONG TERM GOAL #3   Title in the morning go down stair with step over step pattern and mild to no pain   Time 8   Period Weeks   Status New   PT LONG TERM GOAL #4   Title walk 30 minutes 2 times per week   Time 8   Period Weeks   Status New   PT LONG TERM GOAL #5   Title stand to cook a meal or house hold tasks for 30 minutes with mild to no pain   Time 8   Period Weeks   Status New   Additional Long Term Goals   Additional Long Term Goals Yes   PT LONG TERM GOAL #6   Title pain in right hip and ankle decreased >/= 75%   Time 8   Period Weeks   Status New               Plan -  10/22/14 1437    Clinical Impression Statement Pt able to perform all exercise without increased ankle pain.  Pt hypersensitive to light touch over Rt medial ankle invertor muscles.     Pt will benefit from skilled therapeutic intervention in order to improve on the following deficits Decreased strength;Decreased mobility;Impaired flexibility;Pain   Rehab Potential Good   PT Frequency 2x / week   PT Duration 8 weeks   PT Treatment/Interventions Patient/family education;Therapeutic exercise;Manual techniques;Other (comment)   PT Next Visit Plan Continue ionto, soft tissue if tolerated, ankle proprioception and strength   Consulted and Agree with Plan of Care Patient        Problem List Patient Active Problem List   Diagnosis Date Noted  . Raynaud's phenomenon 11/05/2013  . Numbness 11/05/2013  . Paresthesia 09/12/2013    Sora Vrooman, PT  10/22/2014, 2:41 PM  [facility] [address], [address] [place], [place], [address] Phone: [phone]   Fax:  [phone]

## 2014-10-22 NOTE — Patient Instructions (Addendum)
Heel Raises   Stand with support. Tighten pelvic floor and hold. With knees straight, raise heels off ground. Hold ___ seconds. Relax for ___ seconds. Repeat _10-20__ times. Do _3__ times a day.  Copyright  VHI. All rights reserved.  Single Leg - Eyes Open  Holding support, lift right leg while maintaining balance over other leg. Progress to removing hands from support surface for longer periods of time. Hold__10__ seconds. Repeat _5___ times per session. Do _2___ sessions per day.   IONTOPHORESIS PATIENT PRECAUTIONS & CONTRAINDICATIONS:  . Redness under one or both electrodes can occur.  This characterized by a uniform redness that usually disappears within 12 hours of treatment. . Small pinhead size blisters may result in response to the drug.  Contact your physician if the problem persists more than 24 hours. . On rare occasions, iontophoresis therapy can result in temporary skin reactions such as rash, inflammation, irritation or burns.  The skin reactions may be the result of individual sensitivity to the ionic solution used, the condition of the skin at the start of treatment, reaction to the materials in the electrodes, allergies or sensitivity to dexamethasone, or a poor connection between the patch and your skin.  Discontinue using iontophoresis if you have any of these reactions and report to your therapist. . Remove the Patch or electrodes if you have any undue sensation of pain or burning during the treatment and report discomfort to your therapist. . Tell your Therapist if you have had known adverse reactions to the application of electrical current. . If using the Patch, the LED light will turn off when treatment is complete and the patch can be removed.  Approximate treatment time is 1-3 hours.  Remove the patch when light goes off or after 6 hours. . The Patch can be worn during normal activity, however excessive motion where the electrodes have been placed can cause poor  contact between the skin and the electrode or uneven electrical current resulting in greater risk of skin irritation. Marland Kitchen. Keep out of the reach of children.   . DO NOT use if you have a cardiac pacemaker or any other electrically sensitive implanted device. . DO NOT use if you have a known sensitivity to dexamethasone. . DO NOT use during Magnetic Resonance Imaging (MRI). . DO NOT use over broken or compromised skin (e.g. sunburn, cuts, or acne) due to the increased risk of skin reaction. . DO NOT SHAVE over the area to be treated:  To establish good contact between the Patch and the skin, excessive hair may be clipped. . DO NOT place the Patch or electrodes on or over your eyes, directly over your heart, or brain. . DO NOT reuse the Patch or electrodes as this may cause burns to occur. .Marland Kitchen

## 2014-10-26 ENCOUNTER — Ambulatory Visit: Payer: Medicare Other

## 2014-10-26 DIAGNOSIS — R29898 Other symptoms and signs involving the musculoskeletal system: Secondary | ICD-10-CM

## 2014-10-26 DIAGNOSIS — M79604 Pain in right leg: Secondary | ICD-10-CM | POA: Diagnosis not present

## 2014-10-26 DIAGNOSIS — M79661 Pain in right lower leg: Secondary | ICD-10-CM

## 2014-10-26 NOTE — Patient Instructions (Signed)

## 2014-10-26 NOTE — Therapy (Signed)
[facility] [address], [address] [place], [place], [address] Phone: [phone]   Fax:  [phone]  Physical Therapy Treatment  Patient Details  Name: Stacey Hull MRN: [id number] Date of Birth: 04/07/1946 Referring Provider:  Frederich ChickWebb, Carol D, MD  Encounter Date: 10/26/2014      PT End of Session - 10/26/14 1127    Visit Number 5   Number of Visits --  10-Medicare   Date for PT Re-Evaluation 11/27/14   PT Start Time 1105   PT Stop Time 1143   PT Time Calculation (min) 38 min   Activity Tolerance Patient tolerated treatment well   Behavior During Therapy [facility] for tasks assessed/performed      History reviewed. No pertinent past medical history.  History reviewed. No pertinent past surgical history.  There were no vitals taken for this visit.  Visit Diagnosis:  Pain of right lower leg  Ankle weakness      Subjective Assessment - 10/26/14 1107    Symptoms Pt is feeling good and medial Rt ankle isnt tender at all today.   Pain Score 2    Pain Location Ankle   Pain Orientation Right   Pain Descriptors / Indicators Sore   Pain Onset More than a month ago   Pain Frequency Constant   Aggravating Factors  movement, standing and walking   Pain Relieving Factors not needed per pt report.    Multiple Pain Sites No          [facility] PT Assessment - 10/26/14 0001    Assessment   Medical Diagnosis Lower leg pain (M79.669)   Onset Date 06/28/14                  [facility] Treatment/Exercise - 10/26/14 0001    Knee/Hip Exercises: Aerobic   Stationary Bike Level 2 x 8 minutes   Knee/Hip Exercises: Standing   Walking with Sports Cord 20# forward and reverse x 10   verbal cues for gait pattern   Iontophoresis   Type of Iontophoresis Dexamethasone  #5   Location medial right ankle   Dose 1ml   Time 6 hours   Ankle Exercises: Seated   Other Seated Ankle Exercises seated rockerboard: PF/DF and  inv/ev x 2 minutes   Ankle Exercises: Standing   SLS 5x10 seconds- added to HEP  Good return dem   Heel Raises 20 reps  good return demo of HEP   Other Standing Ankle Exercises mini tramp- weight shifting 3 ways x 1 minute                PT Education - 10/26/14 1119    Education Details ionto   Person(s) Educated Patient   Methods Explanation;Handout   Comprehension Verbalized understanding          PT Short Term Goals - 10/26/14 1124    PT SHORT TERM GOAL #1   Title be independent with initial HEP for flexibility exercises   Status Achieved   PT SHORT TERM GOAL #2   Title walk 15 minutes 2 times per week with mild pain   Period Weeks   Status Achieved   PT SHORT TERM GOAL #3   Title stand to cook a meal or household tasks for 15 minutes with mild to no pain   Status Achieved           PT Long Term Goals - 10/26/14 1124    PT LONG TERM GOAL #1  Title demonstrate and/or verbalize techniques to reduce the rils of re-injury to include info on anti -inflammatory   Time 8   Period Weeks   Status Achieved   PT LONG TERM GOAL #2   Title be independent with advanced HEP    Time 8   Period Weeks   Status On-going  Pt is independnet and compliant with current HEP   PT LONG TERM GOAL #3   Title in the morning go down stair with step over step pattern and mild to no pain   Time 8   Period Weeks   Status On-going  Continuing to descend with step-to gait secondary to stiffness               Plan - 10/26/14 1126    Clinical Impression Statement Pt reports that Rt ankle feels 75% better since the start of care.  Pt able to tolerate increased time, resistance and new activity without pain or limitation.     Pt will benefit from skilled therapeutic intervention in order to improve on the following deficits Decreased strength;Decreased mobility;Impaired flexibility;Pain   Rehab Potential Good   PT Frequency 2x / week   PT Duration 8 weeks   PT  Treatment/Interventions Patient/family education;Therapeutic exercise;Manual techniques;Other (comment)  ionto   PT Next Visit Plan 1 more ionto patch, soft tissue if tolerated, ankle proprioception and strength   Consulted and Agree with Plan of Care Patient        Problem List Patient Active Problem List   Diagnosis Date Noted  . Raynaud's phenomenon 11/05/2013  . Numbness 11/05/2013  . Paresthesia 09/12/2013    TAKACS,KELLY,PT 10/26/2014, 11:47 AM  [facility] [address], [address] [place], [place], [address] Phone: [phone]   Fax:  [phone]

## 2014-10-27 ENCOUNTER — Other Ambulatory Visit (HOSPITAL_COMMUNITY): Payer: Self-pay | Admitting: Family Medicine

## 2014-10-27 DIAGNOSIS — Z1231 Encounter for screening mammogram for malignant neoplasm of breast: Secondary | ICD-10-CM

## 2014-10-29 ENCOUNTER — Encounter: Payer: Self-pay | Admitting: Physical Therapy

## 2014-10-29 ENCOUNTER — Ambulatory Visit: Payer: Medicare Other | Attending: Family Medicine | Admitting: Physical Therapy

## 2014-10-29 DIAGNOSIS — M79604 Pain in right leg: Secondary | ICD-10-CM | POA: Diagnosis not present

## 2014-10-29 DIAGNOSIS — M79661 Pain in right lower leg: Secondary | ICD-10-CM

## 2014-10-29 DIAGNOSIS — R29898 Other symptoms and signs involving the musculoskeletal system: Secondary | ICD-10-CM

## 2014-10-29 NOTE — Therapy (Signed)
[facility] [address] [place], [place], [address] Phone: [phone]   Fax:  [phone]  Physical Therapy Treatment  Patient Details  Name: Stacey Hull MRN: [id number] Date of Birth: May 22, 1946 Referring Provider:  Frederich Chick, MD  Encounter Date: 10/29/2014      PT End of Session - 10/29/14 1148    Visit Number 6   Number of Visits 10  medicare   Date for PT Re-Evaluation 11/27/14   PT Start Time 1100   PT Stop Time 1145   PT Time Calculation (min) 45 min   Activity Tolerance Patient tolerated treatment well   Behavior During Therapy [facility] for tasks assessed/performed      History reviewed. No pertinent past medical history.  History reviewed. No pertinent past surgical history.  There were no vitals taken for this visit.  Visit Diagnosis:  Pain of right lower leg  Ankle weakness      Subjective Assessment - 10/29/14 1115    Symptoms Increased pain with over exertion. Patient reports right ankle pain is 90% better. Patient ankle gets tired when doing too much.    Limitations Walking;House hold activities   How long can you stand comfortably? not difficulty   How long can you walk comfortably? 20 minutes   Patient Stated Goals reduce pain in right ankle and hip to walk and stand.    Currently in Pain? Yes   Pain Score 2    Pain Location Ankle   Pain Orientation Right   Pain Type Chronic pain   Pain Radiating Towards None   Pain Onset More than a month ago   Pain Frequency Intermittent   Aggravating Factors  walking for long periods of time   Pain Relieving Factors resting right ankle   Multiple Pain Sites No                    [facility] Adult PT Treatment/Exercise - 10/29/14 0001    Knee/Hip Exercises: Aerobic   Stationary Bike Level 2 x 8 minutes   Knee/Hip Exercises: Standing   Lateral Step Up Right;10 reps  on BOSU ball round side   Forward Step Up Right;10 reps   round side of BOSU ball   Walking with Sports Cord 20# forward and reverse x 10   verbal cues for gait pattern   Other Standing Knee Exercises stand on ovals pulling band to opposite arm in tandem stance 15x all 4 ways   Iontophoresis   Type of Iontophoresis Dexamethasone  #6   Location medial right ankle   Dose 1ml   Time 6 hours   Ankle Exercises: Standing   BAPS Standing;Level 2;10 reps  pf/df, side to side, CC/CW with therapist guiding   Rebounder heel raises 1 minute   Other Standing Ankle Exercises mini trampoline 3ways with exaggerated movements 1 minute each                PT Education - 10/29/14 1148    Education provided No          PT Short Term Goals - 10/26/14 1124    PT SHORT TERM GOAL #1   Title be independent with initial HEP for flexibility exercises   Status Achieved   PT SHORT TERM GOAL #2   Title walk 15 minutes 2 times per week with mild pain   Period Weeks   Status Achieved   PT SHORT TERM GOAL #3   Title stand to  cook a meal or household tasks for 15 minutes with mild to no pain   Status Achieved           PT Long Term Goals - 10/26/14 1124    PT LONG TERM GOAL #1   Title demonstrate and/or verbalize techniques to reduce the rils of re-injury to include info on anti -inflammatory   Time 8   Period Weeks   Status Achieved   PT LONG TERM GOAL #2   Title be independent with advanced HEP    Time 8   Period Weeks   Status On-going  Pt is independnet and compliant with current HEP   PT LONG TERM GOAL #3   Title in the morning go down stair with step over step pattern and mild to no pain   Time 8   Period Weeks   Status On-going  Continuing to descend with step-to gait secondary to stiffness               Plan - 10/29/14 1149    Clinical Impression Statement Patient reports ankle feels 90% better.  Pain is intermittent and rarely.     Pt will benefit from skilled therapeutic intervention in order to improve on the  following deficits Decreased strength;Decreased mobility;Impaired flexibility;Pain   Rehab Potential Good   Clinical Impairments Affecting Rehab Potential None   PT Frequency 2x / week   PT Duration 8 weeks   PT Treatment/Interventions Neuromuscular re-education;Other (comment)  iontophoresis with 1 ml of dexamethasone   PT Next Visit Plan No more iontophoresis due to using all 6 patches   PT Home Exercise Plan knee theraband exercises   Consulted and Agree with Plan of Care Patient        Problem List Patient Active Problem List   Diagnosis Date Noted  . Raynaud's phenomenon 11/05/2013  . Numbness 11/05/2013  . Paresthesia 09/12/2013    Carri Spillers, PT 10/29/2014, 11:54 AM  [facility] [address] [place], [place], [address] Phone: [phone]   Fax:  [phone]

## 2014-11-04 ENCOUNTER — Ambulatory Visit: Payer: Medicare Other

## 2014-11-06 ENCOUNTER — Ambulatory Visit: Payer: Medicare Other | Admitting: Physical Therapy

## 2014-11-06 ENCOUNTER — Ambulatory Visit (HOSPITAL_COMMUNITY): Payer: Medicare Other

## 2014-11-09 ENCOUNTER — Ambulatory Visit: Payer: Medicare Other

## 2014-11-09 DIAGNOSIS — R05 Cough: Secondary | ICD-10-CM | POA: Diagnosis not present

## 2014-11-09 DIAGNOSIS — R29898 Other symptoms and signs involving the musculoskeletal system: Secondary | ICD-10-CM

## 2014-11-09 DIAGNOSIS — M79604 Pain in right leg: Secondary | ICD-10-CM | POA: Diagnosis not present

## 2014-11-09 DIAGNOSIS — R52 Pain, unspecified: Secondary | ICD-10-CM | POA: Diagnosis not present

## 2014-11-09 NOTE — Therapy (Signed)
[facility] [address], [address] [place], [place], [address] Phone: [phone]   Fax:  [phone]  Physical Therapy Treatment  Patient Details  Name: Stacey Hull MRN: [id number] Date of Birth: Feb 01, 1946 Referring Provider:  Shirlean Mylar, MD  Encounter Date: 11/09/2014      PT End of Session - 11/09/14 1126    Visit Number 7   Number of Visits 10  Medicare   Date for PT Re-Evaluation 11/27/14   PT Start Time 1101   PT Stop Time 1127   PT Time Calculation (min) 26 min   Activity Tolerance Patient tolerated treatment well   Behavior During Therapy [facility] for tasks assessed/performed      History reviewed. No pertinent past medical history.  History reviewed. No pertinent past surgical history.  There were no vitals filed for this visit.  Visit Diagnosis:  Ankle weakness      Subjective Assessment - 11/09/14 1107    Symptoms Pt has been sick and in bed for the past week.  Feeling better now just fatigued.   Currently in Pain? No/denies                       [facility] Adult PT Treatment/Exercise - 11/09/14 0001    Knee/Hip Exercises: Aerobic   Stationary Bike Level 2 x 8 minutes  PT present to discuss progress   Knee/Hip Exercises: Standing   Walking with Sports Cord 20# forward x 10    Ankle Exercises: Seated   Other Seated Ankle Exercises Seated Level 3 on Rt- 4 ways x 2 minutes each                  PT Short Term Goals - 11/09/14 1110    PT SHORT TERM GOAL #1   Title be independent with initial HEP for flexibility exercises   Status Achieved   PT SHORT TERM GOAL #2   Title walk 15 minutes 2 times per week with mild pain   Status Achieved   PT SHORT TERM GOAL #3   Title stand to cook a meal or household tasks for 15 minutes with mild to no pain   Status Achieved           PT Long Term Goals - 11/09/14 1111    PT LONG TERM GOAL #2   Title be independent with advanced  HEP    Time 8   Period Weeks   Status On-going  independent in current HEP and reports mimimal compliance due to illness   PT LONG TERM GOAL #3   Title in the morning go down stair with step over step pattern and mild to no pain   Time 8   Period Weeks   Status On-going  descends with step-to gait first thing in the morning               Plan - 11/09/14 1118    Clinical Impression Statement Pt reports 90% overall improvement and denies pain this week.  Pt has been sick and endurance is limited today. Session ended early due to Pt with MD appointment at 11:30.   PT Frequency 2x / week   PT Duration 8 weeks   PT Treatment/Interventions Neuromuscular re-education;Therapeutic exercise   PT Next Visit Plan Rt ankle sterngth and proprioception, advance exercise if pt is feeling better.   Consulted and Agree with Plan of Care Patient        Problem List  Patient Active Problem List   Diagnosis Date Noted  . Raynaud's phenomenon 11/05/2013  . Numbness 11/05/2013  . Paresthesia 09/12/2013    Chett Taniguchi , PT  11/09/2014, 11:27 AM  [facility] [address], [address] [place], [place], [address] Phone: [phone]   Fax:  [phone]

## 2014-11-10 ENCOUNTER — Ambulatory Visit (HOSPITAL_COMMUNITY): Payer: Medicare Other

## 2014-11-19 ENCOUNTER — Ambulatory Visit: Payer: Medicare Other

## 2014-11-19 DIAGNOSIS — M79604 Pain in right leg: Secondary | ICD-10-CM | POA: Diagnosis not present

## 2014-11-19 DIAGNOSIS — R29898 Other symptoms and signs involving the musculoskeletal system: Secondary | ICD-10-CM

## 2014-11-19 NOTE — Therapy (Signed)
[facility] [address], [address] [place], [place], [address] Phone: [phone]   Fax:  [phone]  Physical Therapy Treatment  Patient Details  Name: Stacey AlbeeJacqueline H Silverthorn MRN: [id number] Date of Birth: 08/03/1946 Referring Provider:  Farris HasMorrow, Aaron, MD  Encounter Date: 11/19/2014      PT End of Session - 11/19/14 1010    Visit Number 8   Number of Visits 10  Medicare   Date for PT Re-Evaluation 11/27/14   PT Start Time 0931   PT Stop Time 1014   PT Time Calculation (min) 43 min   Activity Tolerance Patient tolerated treatment well   Behavior [facility] for tasks assessed/performed      History reviewed. No pertinent past medical history.  History reviewed. No pertinent past surgical history.  There were no vitals filed for this visit.  Visit Diagnosis:  Ankle weakness      Subjective Assessment - 11/19/14 0942    Symptoms Pt reports mild ankle edema after wearing heels to church on Sunday.  Pt has been in bed alot so a mild flare-up in Rt ankle pain.     Currently in Pain? Yes   Pain Score 2    Pain Location Ankle   Pain Orientation Right   Pain Descriptors / Indicators Sore   Pain Onset More than a month ago   Pain Frequency Intermittent   Aggravating Factors  walking a long time   Pain Relieving Factors rest, not walking   Multiple Pain Sites No            [facility] PT Assessment - 11/19/14 0001    Observation/Other Assessments   Focus on Therapeutic Outcomes (FOTO)  40% limitation                   [facility] Adult PT Treatment/Exercise - 11/19/14 0001    Knee/Hip Exercises: Aerobic   Stationary Bike Level 2 x 8 minutes  PT present to discuss progress   Knee/Hip Exercises: Standing   Step Down Right;2 sets;10 reps;Hand Hold: 2;Step Height: 6"   Walking with Sports Cord 20# 4 ways x 10    Ankle Exercises: Standing   Rebounder heel raises 1 minute   Other Standing Ankle Exercises  rockerboard: PF/DF x 3 minutes   Other Standing Ankle Exercises mini trampoline 3ways with exaggerated movements 1 minute each   Ankle Exercises: Seated   Other Seated Ankle Exercises Seated Baps Level 3 on Rt- 4 ways x 2 minutes each  improved control with Baps today                  PT Short Term Goals - 11/09/14 1110    PT SHORT TERM GOAL #1   Title be independent with initial HEP for flexibility exercises   Status Achieved   PT SHORT TERM GOAL #2   Title walk 15 minutes 2 times per week with mild pain   Status Achieved   PT SHORT TERM GOAL #3   Title stand to cook a meal or household tasks for 15 minutes with mild to no pain   Status Achieved           PT Long Term Goals - 11/19/14 0945    PT LONG TERM GOAL #2   Title be independent with advanced HEP    Status On-going  Pt has had limited compliance due to illness   PT LONG TERM GOAL #3   Title in the morning go  down stair with step over step pattern and mild to no pain   Time 8   Period Weeks   Status On-going  Pt taking 1 step at a time first thing in the morning only   PT LONG TERM GOAL #4   Title walk 30 minutes 2 times per week   Time 8   Period Weeks   Status On-going  15-20 minutes max   PT LONG TERM GOAL #5   Title stand to cook a meal or house hold tasks for 30 minutes with mild to no pain   Time 8   Period Weeks   PT LONG TERM GOAL #6   Title pain in right hip and ankle decreased >/= 75%   Time 8   Period Weeks   Status Achieved  90% improved               Plan - 11/19/14 0937    Clinical Impression Statement Pt with limited attendance over the past 2 weeks due to illness.  Pt reports 90% overall improvement since the start of care regarding Rt ankle pain.  See goals for status.   Pt will benefit from skilled therapeutic intervention in order to improve on the following deficits Decreased strength;Decreased mobility;Impaired flexibility;Pain   Rehab Potential Good   PT Frequency  2x / week   PT Duration 8 weeks   PT Treatment/Interventions Neuromuscular re-education;Therapeutic exercise   PT Next Visit Plan Probably D/C to HEP.  Report G-codes and D/C FOTO if pt discharges.     Consulted and Agree with Plan of Care Patient        Problem List Patient Active Problem List   Diagnosis Date Noted  . Raynaud's phenomenon 11/05/2013  . Numbness 11/05/2013  . Paresthesia 09/12/2013    Sheniah Supak, PT 11/19/2014, 10:13 AM  [facility] [address], [address] [place], [place], [address] Phone: [phone]   Fax:  [phone]

## 2014-11-24 ENCOUNTER — Ambulatory Visit: Payer: Medicare Other

## 2014-11-24 DIAGNOSIS — M79661 Pain in right lower leg: Secondary | ICD-10-CM

## 2014-11-24 DIAGNOSIS — R29898 Other symptoms and signs involving the musculoskeletal system: Secondary | ICD-10-CM

## 2014-11-24 DIAGNOSIS — M79604 Pain in right leg: Secondary | ICD-10-CM | POA: Diagnosis not present

## 2014-11-24 NOTE — Therapy (Signed)
[facility] [address], [place], [place], [address] Phone: [phone]   Fax:  [phone]  Physical Therapy Treatment  Patient Details  Name: Stacey Hull MRN: [id number] Date of Birth: 07-08-1946 Referring Provider:  Maurice Small, MD  Encounter Date: 11/24/2014      PT End of Session - 11/24/14 1137    Visit Number 9   PT Start Time 7416   PT Stop Time 1136   PT Time Calculation (min) 34 min   Activity Tolerance Patient tolerated treatment well   Behavior During Therapy [facility] for tasks assessed/performed      History reviewed. No pertinent past medical history.  History reviewed. No pertinent past surgical history.  There were no vitals filed for this visit.  Visit Diagnosis:  Ankle weakness  Pain of right lower leg      Subjective Assessment - 11/24/14 1111    Symptoms Rt ankle pain has been off and on.  Ready for D/C.  Pt reports 80% overall improvement since the start of care.     Currently in Pain? Yes   Pain Score 2    Pain Location Ankle   Pain Orientation Right   Pain Descriptors / Indicators Tightness   Pain Type Chronic pain   Pain Onset More than a month ago   Pain Frequency Intermittent   Aggravating Factors  early morning   Pain Relieving Factors rest, latera in the day   Multiple Pain Sites No                       [facility] Adult PT Treatment/Exercise - 11/24/14 0001    Knee/Hip Exercises: Aerobic   Stationary Bike Level 2 x 8 minutes  PT present to discuss discharge   Knee/Hip Exercises: Standing   Step Down Right;2 sets;10 reps;Hand Hold: 2;Step Height: 6"   Walking with Sports Cord 20# 4 ways x 10    Ankle Exercises: Standing   Other Standing Ankle Exercises mini trampoline 3ways with exaggerated movements 1 minute each   Ankle Exercises: Seated   Other Seated Ankle Exercises Seated Baps Level 3 on Rt- 4 ways x 2 minutes each  improved control with Baps  today                  PT Short Term Goals - 11/09/14 1110    PT SHORT TERM GOAL #1   Title be independent with initial HEP for flexibility exercises   Status Achieved   PT SHORT TERM GOAL #2   Title walk 15 minutes 2 times per week with mild pain   Status Achieved   PT SHORT TERM GOAL #3   Title stand to cook a meal or household tasks for 15 minutes with mild to no pain   Status Achieved           PT Long Term Goals - 11/24/14 1115    PT LONG TERM GOAL #1   Title demonstrate and/or verbalize techniques to reduce the rils of re-injury to include info on anti -inflammatory   Status Achieved   PT LONG TERM GOAL #2   Title be independent with advanced HEP    Status Achieved   PT LONG TERM GOAL #3   Title in the morning go down stair with step over step pattern and mild to no pain   Status Partially Met  taking 1 step at a time with descending on the first time.  PT LONG TERM GOAL #4   Title walk 30 minutes 2 times per week   Status Achieved   PT LONG TERM GOAL #5   Title stand to cook a meal or house hold tasks for 30 minutes with mild to no pain   Status Achieved  1-2 hours reported   PT LONG TERM GOAL #6   Title pain in right hip and ankle decreased >/= 75%   Status Achieved               Plan - 12-18-2014 1121    Clinical Impression Statement Pt reports 80% overall improvement since the start of care.  Pt has met or partially met all long term goals.     PT Next Visit Plan D/C to HEP today.   Consulted and Agree with Plan of Care Patient          G-Codes - December 18, 2014 1119    Functional Assessment Tool Used FOTO: 40% limitation   Functional Limitation Mobility: Walking and moving around   Mobility: Walking and Moving Around Goal Status [phone]) At least 40 percent but less than 60 percent impaired, limited or restricted   Mobility: Walking and Moving Around Discharge Status [phone]) At least 20 percent but less than 40 percent impaired, limited or  restricted      Problem List Patient Active Problem List   Diagnosis Date Noted  . Raynaud's phenomenon 11/05/2013  . Numbness 11/05/2013  . Paresthesia 09/12/2013   PHYSICAL THERAPY DISCHARGE SUMMARY  Visits from Start of Care: 9  Current functional level related to goals / functional outcomes: See goals for status.  Pt has met all goals.    Remaining deficits: Mild Rt ankle pain after long periods of standing and first hours of the morning.  Pt has HEP in place and will follow-up with MD as needed.  Thank you for this referral.     Education / Equipment: HEP Plan: Patient agrees to discharge.  Patient goals were met. Patient is being discharged due to meeting the stated rehab goals.  ?????     TAKACS,KELLY, PT Dec 18, 2014, 11:38 AM  [facility] [address] [place], [place], [address] Phone: [phone]   Fax:  [phone]

## 2014-12-07 ENCOUNTER — Ambulatory Visit (HOSPITAL_COMMUNITY)
Admission: RE | Admit: 2014-12-07 | Discharge: 2014-12-07 | Disposition: A | Discharge status: Home / Self Care | Payer: Medicare Other | Source: Ambulatory Visit | Attending: Family Medicine | Admitting: Family Medicine

## 2014-12-07 DIAGNOSIS — Z1231 Encounter for screening mammogram for malignant neoplasm of breast: Secondary | ICD-10-CM | POA: Insufficient documentation

## 2014-12-08 DIAGNOSIS — M25571 Pain in right ankle and joints of right foot: Secondary | ICD-10-CM | POA: Diagnosis not present

## 2014-12-17 DIAGNOSIS — M76821 Posterior tibial tendinitis, right leg: Secondary | ICD-10-CM | POA: Diagnosis not present

## 2014-12-25 DIAGNOSIS — M25571 Pain in right ankle and joints of right foot: Secondary | ICD-10-CM | POA: Diagnosis not present

## 2014-12-31 DIAGNOSIS — M76821 Posterior tibial tendinitis, right leg: Secondary | ICD-10-CM | POA: Diagnosis not present

## 2015-01-14 DIAGNOSIS — E780 Pure hypercholesterolemia: Secondary | ICD-10-CM | POA: Diagnosis not present

## 2015-01-14 DIAGNOSIS — I7300 Raynaud's syndrome without gangrene: Secondary | ICD-10-CM | POA: Diagnosis not present

## 2015-01-14 DIAGNOSIS — Z0000 Encounter for general adult medical examination without abnormal findings: Secondary | ICD-10-CM | POA: Diagnosis not present

## 2015-01-14 DIAGNOSIS — J301 Allergic rhinitis due to pollen: Secondary | ICD-10-CM | POA: Diagnosis not present

## 2015-01-14 DIAGNOSIS — I10 Essential (primary) hypertension: Secondary | ICD-10-CM | POA: Diagnosis not present

## 2015-01-14 DIAGNOSIS — J329 Chronic sinusitis, unspecified: Secondary | ICD-10-CM | POA: Diagnosis not present

## 2015-01-14 DIAGNOSIS — F330 Major depressive disorder, recurrent, mild: Secondary | ICD-10-CM | POA: Diagnosis not present

## 2015-01-14 DIAGNOSIS — B009 Herpesviral infection, unspecified: Secondary | ICD-10-CM | POA: Diagnosis not present

## 2015-01-14 DIAGNOSIS — E669 Obesity, unspecified: Secondary | ICD-10-CM | POA: Diagnosis not present

## 2015-01-14 DIAGNOSIS — Z8541 Personal history of malignant neoplasm of cervix uteri: Secondary | ICD-10-CM | POA: Diagnosis not present

## 2015-02-10 DIAGNOSIS — M8589 Other specified disorders of bone density and structure, multiple sites: Secondary | ICD-10-CM | POA: Diagnosis not present

## 2015-02-10 DIAGNOSIS — M899 Disorder of bone, unspecified: Secondary | ICD-10-CM | POA: Diagnosis not present

## 2015-02-12 DIAGNOSIS — R05 Cough: Secondary | ICD-10-CM | POA: Diagnosis not present

## 2015-02-12 DIAGNOSIS — J310 Chronic rhinitis: Secondary | ICD-10-CM | POA: Diagnosis not present

## 2015-03-23 DIAGNOSIS — H5211 Myopia, right eye: Secondary | ICD-10-CM | POA: Diagnosis not present

## 2015-03-23 DIAGNOSIS — H25013 Cortical age-related cataract, bilateral: Secondary | ICD-10-CM | POA: Diagnosis not present

## 2015-03-23 DIAGNOSIS — H2513 Age-related nuclear cataract, bilateral: Secondary | ICD-10-CM | POA: Diagnosis not present

## 2015-03-23 DIAGNOSIS — H52221 Regular astigmatism, right eye: Secondary | ICD-10-CM | POA: Diagnosis not present

## 2015-03-23 DIAGNOSIS — H53002 Unspecified amblyopia, left eye: Secondary | ICD-10-CM | POA: Diagnosis not present

## 2015-04-13 DIAGNOSIS — Z6833 Body mass index (BMI) 33.0-33.9, adult: Secondary | ICD-10-CM | POA: Diagnosis not present

## 2015-04-13 DIAGNOSIS — M25571 Pain in right ankle and joints of right foot: Secondary | ICD-10-CM | POA: Diagnosis not present

## 2015-04-13 DIAGNOSIS — Z713 Dietary counseling and surveillance: Secondary | ICD-10-CM | POA: Diagnosis not present

## 2015-04-13 DIAGNOSIS — E669 Obesity, unspecified: Secondary | ICD-10-CM | POA: Diagnosis not present

## 2015-04-13 DIAGNOSIS — B001 Herpesviral vesicular dermatitis: Secondary | ICD-10-CM | POA: Diagnosis not present

## 2015-04-28 DIAGNOSIS — J329 Chronic sinusitis, unspecified: Secondary | ICD-10-CM | POA: Diagnosis not present

## 2015-04-28 DIAGNOSIS — R05 Cough: Secondary | ICD-10-CM | POA: Diagnosis not present

## 2015-05-07 DIAGNOSIS — H25012 Cortical age-related cataract, left eye: Secondary | ICD-10-CM | POA: Diagnosis not present

## 2015-05-07 DIAGNOSIS — H25011 Cortical age-related cataract, right eye: Secondary | ICD-10-CM | POA: Diagnosis not present

## 2015-05-07 DIAGNOSIS — H2511 Age-related nuclear cataract, right eye: Secondary | ICD-10-CM | POA: Diagnosis not present

## 2015-05-07 DIAGNOSIS — H2512 Age-related nuclear cataract, left eye: Secondary | ICD-10-CM | POA: Diagnosis not present

## 2015-05-19 DIAGNOSIS — Z713 Dietary counseling and surveillance: Secondary | ICD-10-CM | POA: Diagnosis not present

## 2015-05-19 DIAGNOSIS — Z5181 Encounter for therapeutic drug level monitoring: Secondary | ICD-10-CM | POA: Diagnosis not present

## 2015-05-19 DIAGNOSIS — Z6831 Body mass index (BMI) 31.0-31.9, adult: Secondary | ICD-10-CM | POA: Diagnosis not present

## 2015-05-19 DIAGNOSIS — E669 Obesity, unspecified: Secondary | ICD-10-CM | POA: Diagnosis not present

## 2015-06-29 DIAGNOSIS — M25571 Pain in right ankle and joints of right foot: Secondary | ICD-10-CM | POA: Diagnosis not present

## 2015-06-29 DIAGNOSIS — E7800 Pure hypercholesterolemia, unspecified: Secondary | ICD-10-CM | POA: Diagnosis not present

## 2015-06-29 DIAGNOSIS — I10 Essential (primary) hypertension: Secondary | ICD-10-CM | POA: Diagnosis not present

## 2015-06-29 DIAGNOSIS — E669 Obesity, unspecified: Secondary | ICD-10-CM | POA: Diagnosis not present

## 2015-06-29 DIAGNOSIS — Z6830 Body mass index (BMI) 30.0-30.9, adult: Secondary | ICD-10-CM | POA: Diagnosis not present

## 2015-06-29 DIAGNOSIS — Z713 Dietary counseling and surveillance: Secondary | ICD-10-CM | POA: Diagnosis not present

## 2015-06-29 DIAGNOSIS — F330 Major depressive disorder, recurrent, mild: Secondary | ICD-10-CM | POA: Diagnosis not present

## 2015-07-29 DIAGNOSIS — Z5181 Encounter for therapeutic drug level monitoring: Secondary | ICD-10-CM | POA: Diagnosis not present

## 2015-07-29 DIAGNOSIS — E669 Obesity, unspecified: Secondary | ICD-10-CM | POA: Diagnosis not present

## 2015-07-29 DIAGNOSIS — M25579 Pain in unspecified ankle and joints of unspecified foot: Secondary | ICD-10-CM | POA: Diagnosis not present

## 2015-07-29 DIAGNOSIS — F419 Anxiety disorder, unspecified: Secondary | ICD-10-CM | POA: Diagnosis not present

## 2015-08-12 DIAGNOSIS — M25571 Pain in right ankle and joints of right foot: Secondary | ICD-10-CM | POA: Diagnosis not present

## 2015-08-17 DIAGNOSIS — M76821 Posterior tibial tendinitis, right leg: Secondary | ICD-10-CM | POA: Diagnosis not present

## 2015-09-20 DIAGNOSIS — H11433 Conjunctival hyperemia, bilateral: Secondary | ICD-10-CM | POA: Diagnosis not present

## 2015-09-20 DIAGNOSIS — H02132 Senile ectropion of right lower eyelid: Secondary | ICD-10-CM | POA: Diagnosis not present

## 2015-09-20 DIAGNOSIS — H02135 Senile ectropion of left lower eyelid: Secondary | ICD-10-CM | POA: Diagnosis not present

## 2015-09-29 DIAGNOSIS — E669 Obesity, unspecified: Secondary | ICD-10-CM | POA: Diagnosis not present

## 2015-09-29 DIAGNOSIS — F411 Generalized anxiety disorder: Secondary | ICD-10-CM | POA: Diagnosis not present

## 2015-09-29 DIAGNOSIS — Z6831 Body mass index (BMI) 31.0-31.9, adult: Secondary | ICD-10-CM | POA: Diagnosis not present

## 2015-09-29 DIAGNOSIS — Z713 Dietary counseling and surveillance: Secondary | ICD-10-CM | POA: Diagnosis not present

## 2015-10-04 DIAGNOSIS — H2512 Age-related nuclear cataract, left eye: Secondary | ICD-10-CM | POA: Diagnosis not present

## 2015-10-04 DIAGNOSIS — H25812 Combined forms of age-related cataract, left eye: Secondary | ICD-10-CM | POA: Diagnosis not present

## 2015-10-29 DIAGNOSIS — Z5181 Encounter for therapeutic drug level monitoring: Secondary | ICD-10-CM | POA: Diagnosis not present

## 2015-10-29 DIAGNOSIS — E669 Obesity, unspecified: Secondary | ICD-10-CM | POA: Diagnosis not present

## 2015-10-29 DIAGNOSIS — Z713 Dietary counseling and surveillance: Secondary | ICD-10-CM | POA: Diagnosis not present

## 2015-10-29 DIAGNOSIS — Z6830 Body mass index (BMI) 30.0-30.9, adult: Secondary | ICD-10-CM | POA: Diagnosis not present

## 2016-01-18 DIAGNOSIS — E785 Hyperlipidemia, unspecified: Secondary | ICD-10-CM | POA: Diagnosis not present

## 2016-01-18 DIAGNOSIS — Z0000 Encounter for general adult medical examination without abnormal findings: Secondary | ICD-10-CM | POA: Diagnosis not present

## 2016-02-05 IMAGING — MG MM SCREENING BREAST TOMO BILATERAL
8 series · 8 of 24 positions shown · non-contrast
Comparison: Previous exam(s).

CLINICAL DATA: Screening.

EXAM:
DIGITAL SCREENING BILATERAL MAMMOGRAM WITH 3D TOMO WITH CAD

[L MLO]
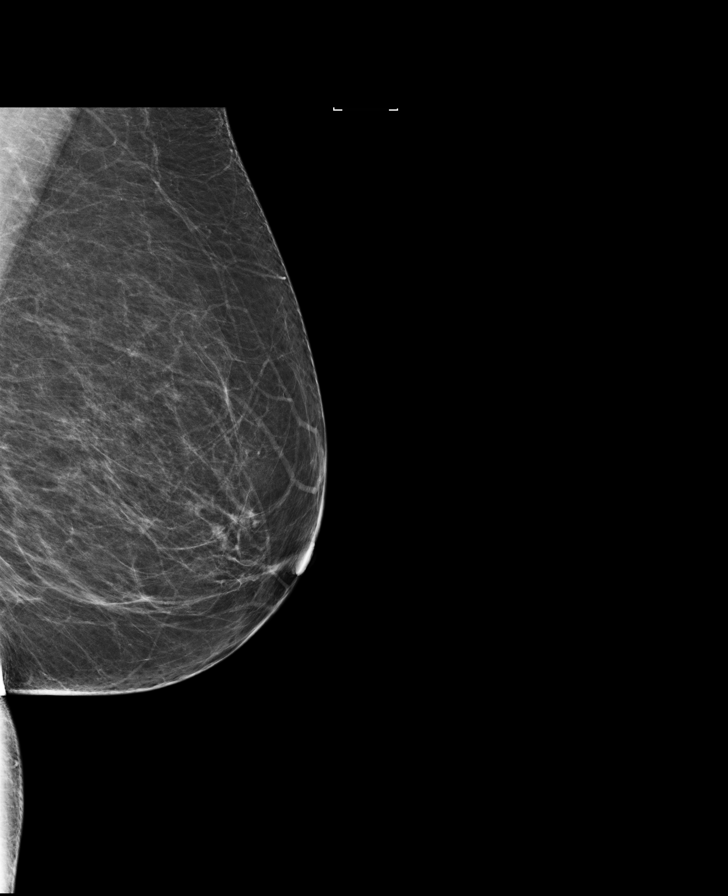

[R MLO]
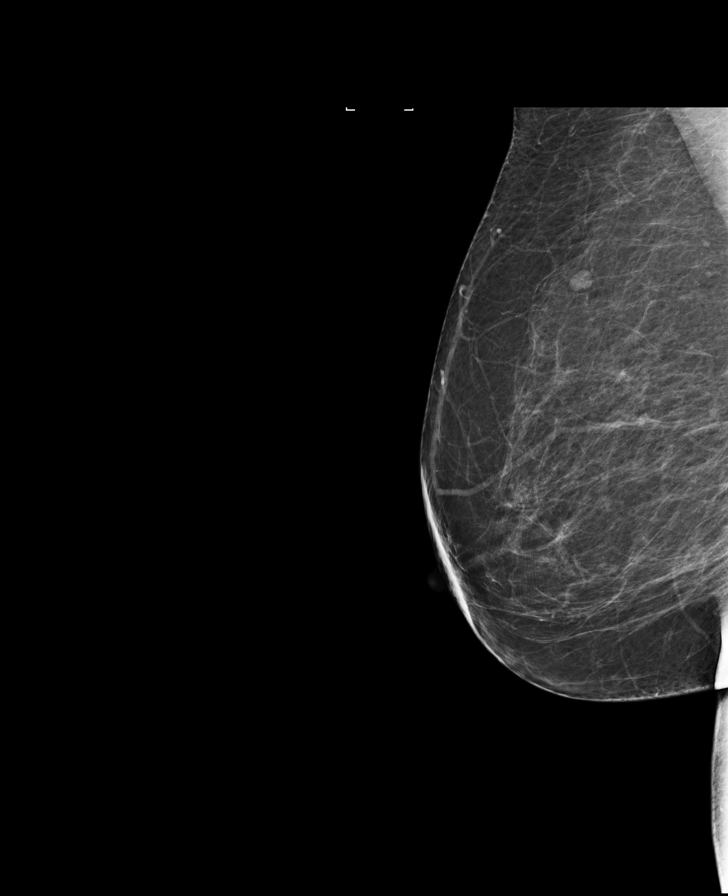

[L CC]
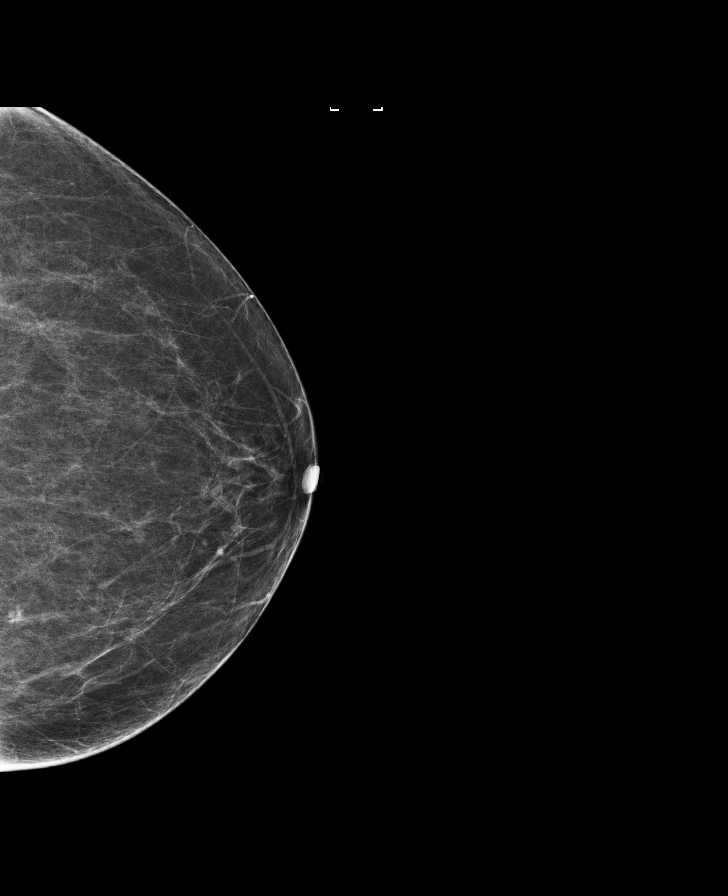

[R CC]
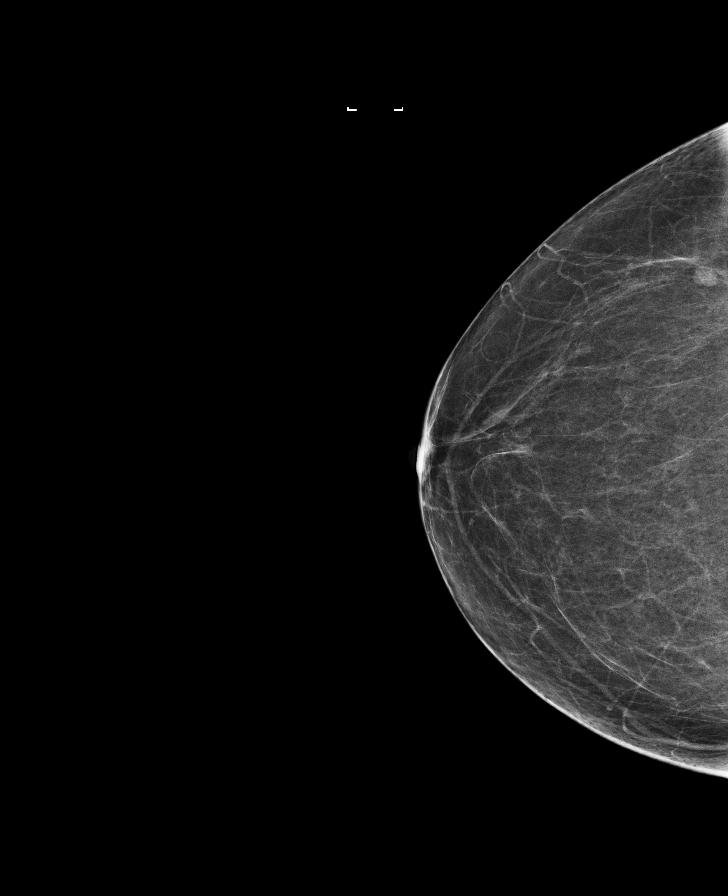

[L MLO tomo · tomo slice 39/78.0]
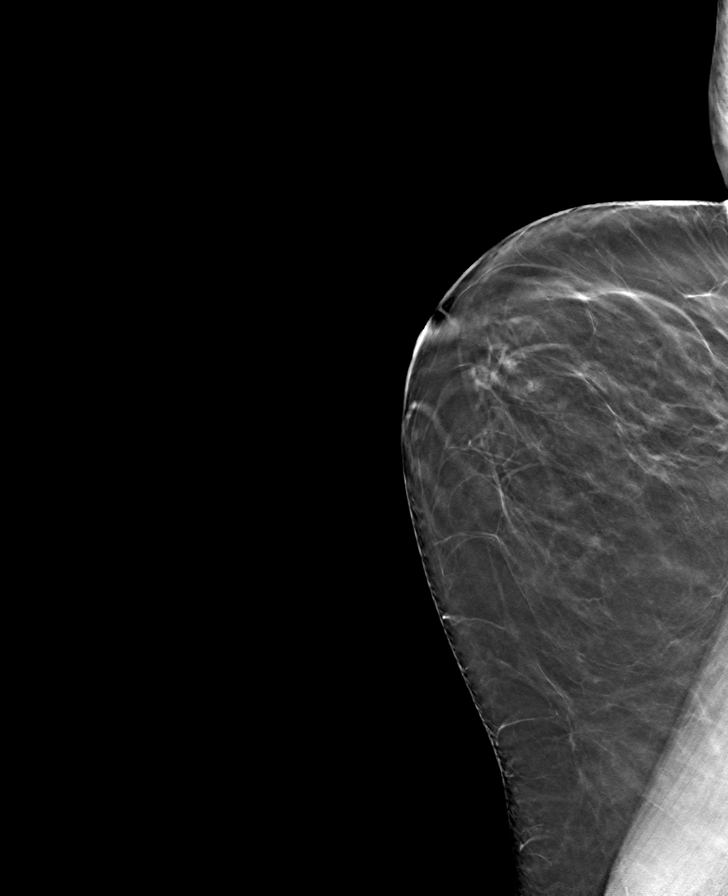

[L CC tomo · tomo slice 38/75.0]
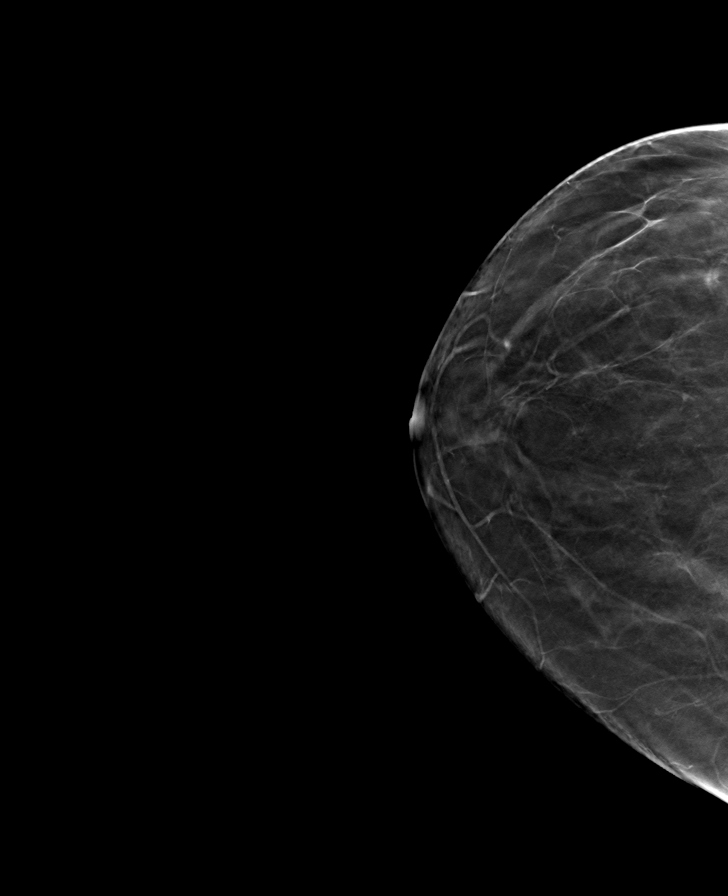

[R MLO tomo · tomo slice 41/81.0]
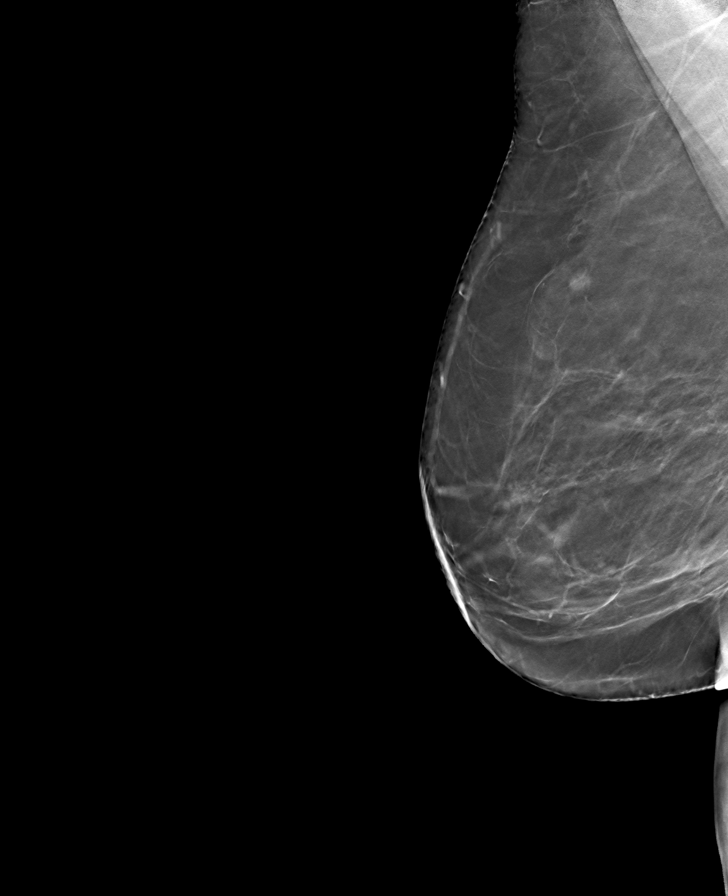

[R CC tomo · tomo slice 36/71.0]
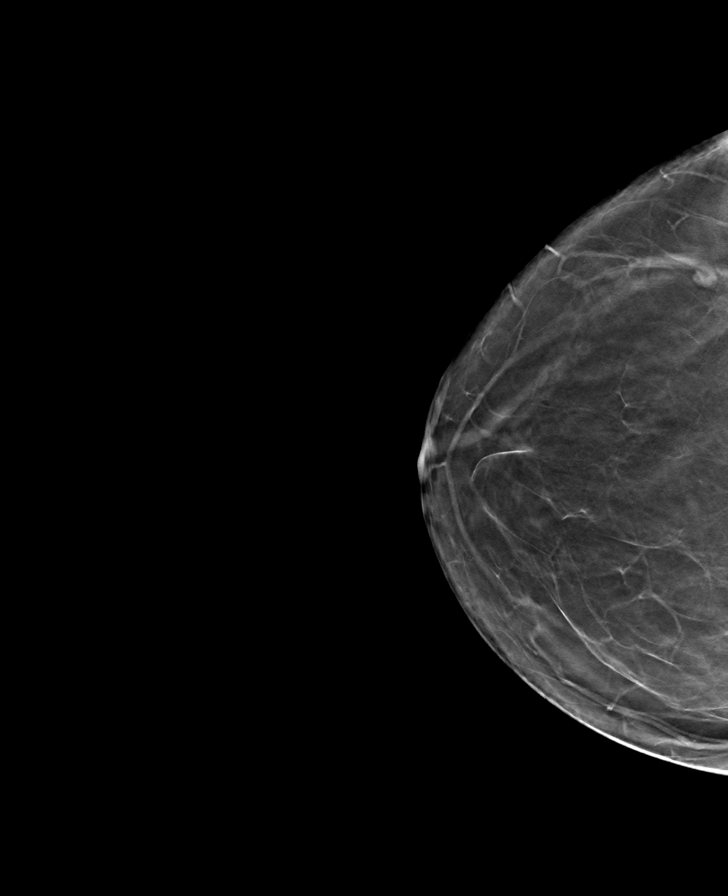

[8 of 24 positions shown; findings below may reference images not displayed]

ACR Breast Density Category b: There are scattered areas of
fibroglandular density.
FINDINGS: There are no findings suspicious for malignancy. Images were
processed with CAD.
IMPRESSION: No mammographic evidence of malignancy. A result letter of this
screening mammogram will be mailed directly to the patient.

RECOMMENDATION:
Screening mammogram in one year. (Code:55-L-23V)

BI-RADS CATEGORY  1: Negative.

## 2016-03-21 ENCOUNTER — Other Ambulatory Visit: Payer: Self-pay | Admitting: Family Medicine

## 2016-03-21 DIAGNOSIS — Z1231 Encounter for screening mammogram for malignant neoplasm of breast: Secondary | ICD-10-CM

## 2016-03-22 ENCOUNTER — Ambulatory Visit
Admission: RE | Admit: 2016-03-22 | Discharge: 2016-03-22 | Disposition: A | Discharge status: Home / Self Care | Payer: Medicare Other | Source: Ambulatory Visit | Attending: Family Medicine | Admitting: Family Medicine

## 2016-03-22 DIAGNOSIS — Z1231 Encounter for screening mammogram for malignant neoplasm of breast: Secondary | ICD-10-CM | POA: Diagnosis not present

## 2016-06-28 DIAGNOSIS — H18413 Arcus senilis, bilateral: Secondary | ICD-10-CM | POA: Diagnosis not present

## 2016-06-28 DIAGNOSIS — Z9842 Cataract extraction status, left eye: Secondary | ICD-10-CM | POA: Diagnosis not present

## 2016-06-28 DIAGNOSIS — H40043 Steroid responder, bilateral: Secondary | ICD-10-CM | POA: Diagnosis not present

## 2016-06-28 DIAGNOSIS — H43813 Vitreous degeneration, bilateral: Secondary | ICD-10-CM | POA: Diagnosis not present

## 2016-06-28 DIAGNOSIS — H2511 Age-related nuclear cataract, right eye: Secondary | ICD-10-CM | POA: Diagnosis not present

## 2016-06-28 DIAGNOSIS — H52221 Regular astigmatism, right eye: Secondary | ICD-10-CM | POA: Diagnosis not present

## 2016-06-28 DIAGNOSIS — H11153 Pinguecula, bilateral: Secondary | ICD-10-CM | POA: Diagnosis not present

## 2016-06-28 DIAGNOSIS — H5211 Myopia, right eye: Secondary | ICD-10-CM | POA: Diagnosis not present

## 2016-06-28 DIAGNOSIS — Z961 Presence of intraocular lens: Secondary | ICD-10-CM | POA: Diagnosis not present

## 2016-06-28 DIAGNOSIS — H11423 Conjunctival edema, bilateral: Secondary | ICD-10-CM | POA: Diagnosis not present

## 2016-06-28 DIAGNOSIS — H04123 Dry eye syndrome of bilateral lacrimal glands: Secondary | ICD-10-CM | POA: Diagnosis not present

## 2016-09-12 DIAGNOSIS — H18411 Arcus senilis, right eye: Secondary | ICD-10-CM | POA: Diagnosis not present

## 2016-09-12 DIAGNOSIS — H25041 Posterior subcapsular polar age-related cataract, right eye: Secondary | ICD-10-CM | POA: Diagnosis not present

## 2016-09-12 DIAGNOSIS — H25011 Cortical age-related cataract, right eye: Secondary | ICD-10-CM | POA: Diagnosis not present

## 2016-09-12 DIAGNOSIS — H2511 Age-related nuclear cataract, right eye: Secondary | ICD-10-CM | POA: Diagnosis not present

## 2017-04-16 ENCOUNTER — Other Ambulatory Visit: Payer: Self-pay | Admitting: Family Medicine

## 2017-04-16 DIAGNOSIS — Z1231 Encounter for screening mammogram for malignant neoplasm of breast: Secondary | ICD-10-CM

## 2017-04-26 ENCOUNTER — Ambulatory Visit
Admission: RE | Admit: 2017-04-26 | Discharge: 2017-04-26 | Disposition: A | Discharge status: Home / Self Care | Payer: Medicare Other | Source: Ambulatory Visit | Attending: Family Medicine | Admitting: Family Medicine

## 2017-04-26 DIAGNOSIS — Z1231 Encounter for screening mammogram for malignant neoplasm of breast: Secondary | ICD-10-CM

## 2017-06-15 DIAGNOSIS — H9113 Presbycusis, bilateral: Secondary | ICD-10-CM | POA: Insufficient documentation

## 2017-06-15 DIAGNOSIS — H93A2 Pulsatile tinnitus, left ear: Secondary | ICD-10-CM | POA: Insufficient documentation

## 2018-04-30 ENCOUNTER — Other Ambulatory Visit: Payer: Self-pay | Admitting: Family Medicine

## 2018-04-30 DIAGNOSIS — Z1231 Encounter for screening mammogram for malignant neoplasm of breast: Secondary | ICD-10-CM

## 2018-05-20 ENCOUNTER — Ambulatory Visit
Admission: RE | Admit: 2018-05-20 | Discharge: 2018-05-20 | Disposition: A | Discharge status: Home / Self Care | Payer: Medicare Other | Source: Ambulatory Visit | Attending: Family Medicine | Admitting: Family Medicine

## 2018-05-20 DIAGNOSIS — Z1231 Encounter for screening mammogram for malignant neoplasm of breast: Secondary | ICD-10-CM

## 2018-05-27 ENCOUNTER — Ambulatory Visit: Payer: Medicare Other

## 2019-04-21 ENCOUNTER — Other Ambulatory Visit: Payer: Self-pay | Admitting: Family Medicine

## 2019-04-21 DIAGNOSIS — Z1231 Encounter for screening mammogram for malignant neoplasm of breast: Secondary | ICD-10-CM

## 2019-06-04 ENCOUNTER — Ambulatory Visit
Admission: RE | Admit: 2019-06-04 | Discharge: 2019-06-04 | Disposition: A | Discharge status: Home / Self Care | Payer: Medicare Other | Source: Ambulatory Visit | Attending: Family Medicine | Admitting: Family Medicine

## 2019-06-04 ENCOUNTER — Other Ambulatory Visit: Payer: Self-pay

## 2019-06-04 DIAGNOSIS — Z1231 Encounter for screening mammogram for malignant neoplasm of breast: Secondary | ICD-10-CM

## 2020-01-30 DIAGNOSIS — H02834 Dermatochalasis of left upper eyelid: Secondary | ICD-10-CM | POA: Diagnosis not present

## 2020-01-30 DIAGNOSIS — H53483 Generalized contraction of visual field, bilateral: Secondary | ICD-10-CM | POA: Diagnosis not present

## 2020-01-30 DIAGNOSIS — H02831 Dermatochalasis of right upper eyelid: Secondary | ICD-10-CM | POA: Diagnosis not present

## 2020-01-30 DIAGNOSIS — H0279 Other degenerative disorders of eyelid and periocular area: Secondary | ICD-10-CM | POA: Diagnosis not present

## 2020-01-30 DIAGNOSIS — H02413 Mechanical ptosis of bilateral eyelids: Secondary | ICD-10-CM | POA: Diagnosis not present

## 2020-01-30 DIAGNOSIS — H02423 Myogenic ptosis of bilateral eyelids: Secondary | ICD-10-CM | POA: Diagnosis not present

## 2020-01-30 DIAGNOSIS — H02835 Dermatochalasis of left lower eyelid: Secondary | ICD-10-CM | POA: Diagnosis not present

## 2020-01-30 DIAGNOSIS — H02832 Dermatochalasis of right lower eyelid: Secondary | ICD-10-CM | POA: Diagnosis not present

## 2020-02-05 DIAGNOSIS — H53483 Generalized contraction of visual field, bilateral: Secondary | ICD-10-CM | POA: Diagnosis not present

## 2020-02-05 DIAGNOSIS — I10 Essential (primary) hypertension: Secondary | ICD-10-CM | POA: Insufficient documentation

## 2020-02-10 DIAGNOSIS — R5382 Chronic fatigue, unspecified: Secondary | ICD-10-CM | POA: Diagnosis not present

## 2020-02-10 DIAGNOSIS — E785 Hyperlipidemia, unspecified: Secondary | ICD-10-CM | POA: Diagnosis not present

## 2020-02-10 DIAGNOSIS — I10 Essential (primary) hypertension: Secondary | ICD-10-CM | POA: Diagnosis not present

## 2020-02-10 DIAGNOSIS — F330 Major depressive disorder, recurrent, mild: Secondary | ICD-10-CM | POA: Diagnosis not present

## 2020-02-10 DIAGNOSIS — Z0000 Encounter for general adult medical examination without abnormal findings: Secondary | ICD-10-CM | POA: Diagnosis not present

## 2020-02-10 DIAGNOSIS — E559 Vitamin D deficiency, unspecified: Secondary | ICD-10-CM | POA: Diagnosis not present

## 2020-03-30 DIAGNOSIS — H02423 Myogenic ptosis of bilateral eyelids: Secondary | ICD-10-CM | POA: Diagnosis not present

## 2020-03-30 DIAGNOSIS — H02834 Dermatochalasis of left upper eyelid: Secondary | ICD-10-CM | POA: Diagnosis not present

## 2020-03-30 DIAGNOSIS — H02413 Mechanical ptosis of bilateral eyelids: Secondary | ICD-10-CM | POA: Diagnosis not present

## 2020-03-30 DIAGNOSIS — H53483 Generalized contraction of visual field, bilateral: Secondary | ICD-10-CM | POA: Diagnosis not present

## 2020-03-30 DIAGNOSIS — H02831 Dermatochalasis of right upper eyelid: Secondary | ICD-10-CM | POA: Diagnosis not present

## 2020-03-30 DIAGNOSIS — H02835 Dermatochalasis of left lower eyelid: Secondary | ICD-10-CM | POA: Diagnosis not present

## 2020-03-30 DIAGNOSIS — H0279 Other degenerative disorders of eyelid and periocular area: Secondary | ICD-10-CM | POA: Diagnosis not present

## 2020-03-30 DIAGNOSIS — H02832 Dermatochalasis of right lower eyelid: Secondary | ICD-10-CM | POA: Diagnosis not present

## 2020-05-10 DIAGNOSIS — H53483 Generalized contraction of visual field, bilateral: Secondary | ICD-10-CM | POA: Diagnosis not present

## 2020-05-19 ENCOUNTER — Other Ambulatory Visit: Payer: Self-pay | Admitting: Family Medicine

## 2020-05-19 DIAGNOSIS — Z1231 Encounter for screening mammogram for malignant neoplasm of breast: Secondary | ICD-10-CM

## 2020-06-01 DIAGNOSIS — Z23 Encounter for immunization: Secondary | ICD-10-CM | POA: Diagnosis not present

## 2020-06-07 ENCOUNTER — Other Ambulatory Visit: Payer: Self-pay

## 2020-06-07 ENCOUNTER — Ambulatory Visit
Admission: RE | Admit: 2020-06-07 | Discharge: 2020-06-07 | Disposition: A | Discharge status: Home / Self Care | Payer: Medicare Other | Source: Ambulatory Visit | Attending: Family Medicine | Admitting: Family Medicine

## 2020-06-07 DIAGNOSIS — Z1231 Encounter for screening mammogram for malignant neoplasm of breast: Secondary | ICD-10-CM | POA: Diagnosis not present

## 2020-06-08 DIAGNOSIS — Z09 Encounter for follow-up examination after completed treatment for conditions other than malignant neoplasm: Secondary | ICD-10-CM | POA: Diagnosis not present

## 2020-06-08 DIAGNOSIS — H02831 Dermatochalasis of right upper eyelid: Secondary | ICD-10-CM | POA: Diagnosis not present

## 2020-06-08 DIAGNOSIS — H02421 Myogenic ptosis of right eyelid: Secondary | ICD-10-CM | POA: Diagnosis not present

## 2020-06-08 DIAGNOSIS — H0279 Other degenerative disorders of eyelid and periocular area: Secondary | ICD-10-CM | POA: Diagnosis not present

## 2020-06-08 DIAGNOSIS — H02834 Dermatochalasis of left upper eyelid: Secondary | ICD-10-CM | POA: Diagnosis not present

## 2020-06-08 DIAGNOSIS — H02423 Myogenic ptosis of bilateral eyelids: Secondary | ICD-10-CM | POA: Diagnosis not present

## 2020-06-08 DIAGNOSIS — H53483 Generalized contraction of visual field, bilateral: Secondary | ICD-10-CM | POA: Diagnosis not present

## 2020-06-21 DIAGNOSIS — L814 Other melanin hyperpigmentation: Secondary | ICD-10-CM | POA: Diagnosis not present

## 2020-06-21 DIAGNOSIS — D225 Melanocytic nevi of trunk: Secondary | ICD-10-CM | POA: Diagnosis not present

## 2020-06-21 DIAGNOSIS — D2371 Other benign neoplasm of skin of right lower limb, including hip: Secondary | ICD-10-CM | POA: Diagnosis not present

## 2020-06-21 DIAGNOSIS — Z85820 Personal history of malignant melanoma of skin: Secondary | ICD-10-CM | POA: Diagnosis not present

## 2020-06-21 DIAGNOSIS — L930 Discoid lupus erythematosus: Secondary | ICD-10-CM | POA: Diagnosis not present

## 2020-06-21 DIAGNOSIS — L905 Scar conditions and fibrosis of skin: Secondary | ICD-10-CM | POA: Diagnosis not present

## 2020-06-25 DIAGNOSIS — L930 Discoid lupus erythematosus: Secondary | ICD-10-CM | POA: Diagnosis not present

## 2020-06-25 DIAGNOSIS — I7300 Raynaud's syndrome without gangrene: Secondary | ICD-10-CM | POA: Diagnosis not present

## 2020-06-25 DIAGNOSIS — R21 Rash and other nonspecific skin eruption: Secondary | ICD-10-CM | POA: Diagnosis not present

## 2020-06-25 DIAGNOSIS — I10 Essential (primary) hypertension: Secondary | ICD-10-CM | POA: Diagnosis not present

## 2020-12-08 DIAGNOSIS — H43813 Vitreous degeneration, bilateral: Secondary | ICD-10-CM | POA: Diagnosis not present

## 2020-12-08 DIAGNOSIS — H04123 Dry eye syndrome of bilateral lacrimal glands: Secondary | ICD-10-CM | POA: Diagnosis not present

## 2020-12-08 DIAGNOSIS — H353111 Nonexudative age-related macular degeneration, right eye, early dry stage: Secondary | ICD-10-CM | POA: Diagnosis not present

## 2020-12-08 DIAGNOSIS — H0102B Squamous blepharitis left eye, upper and lower eyelids: Secondary | ICD-10-CM | POA: Diagnosis not present

## 2020-12-08 DIAGNOSIS — H0102A Squamous blepharitis right eye, upper and lower eyelids: Secondary | ICD-10-CM | POA: Diagnosis not present

## 2020-12-08 DIAGNOSIS — H11153 Pinguecula, bilateral: Secondary | ICD-10-CM | POA: Diagnosis not present

## 2020-12-08 DIAGNOSIS — Z961 Presence of intraocular lens: Secondary | ICD-10-CM | POA: Diagnosis not present

## 2020-12-23 DIAGNOSIS — L930 Discoid lupus erythematosus: Secondary | ICD-10-CM | POA: Diagnosis not present

## 2020-12-23 DIAGNOSIS — I7300 Raynaud's syndrome without gangrene: Secondary | ICD-10-CM | POA: Diagnosis not present

## 2020-12-23 DIAGNOSIS — R21 Rash and other nonspecific skin eruption: Secondary | ICD-10-CM | POA: Diagnosis not present

## 2020-12-23 DIAGNOSIS — I10 Essential (primary) hypertension: Secondary | ICD-10-CM | POA: Diagnosis not present

## 2021-01-20 DIAGNOSIS — K5730 Diverticulosis of large intestine without perforation or abscess without bleeding: Secondary | ICD-10-CM | POA: Diagnosis not present

## 2021-01-20 DIAGNOSIS — Z1211 Encounter for screening for malignant neoplasm of colon: Secondary | ICD-10-CM | POA: Diagnosis not present

## 2021-01-20 DIAGNOSIS — K640 First degree hemorrhoids: Secondary | ICD-10-CM | POA: Diagnosis not present

## 2021-01-20 DIAGNOSIS — K6389 Other specified diseases of intestine: Secondary | ICD-10-CM | POA: Diagnosis not present

## 2021-01-20 DIAGNOSIS — K635 Polyp of colon: Secondary | ICD-10-CM | POA: Diagnosis not present

## 2021-01-25 DIAGNOSIS — K6389 Other specified diseases of intestine: Secondary | ICD-10-CM | POA: Diagnosis not present

## 2021-02-07 DIAGNOSIS — M25571 Pain in right ankle and joints of right foot: Secondary | ICD-10-CM | POA: Diagnosis not present

## 2021-02-24 DIAGNOSIS — F330 Major depressive disorder, recurrent, mild: Secondary | ICD-10-CM | POA: Diagnosis not present

## 2021-02-24 DIAGNOSIS — M1990 Unspecified osteoarthritis, unspecified site: Secondary | ICD-10-CM | POA: Diagnosis not present

## 2021-02-24 DIAGNOSIS — E559 Vitamin D deficiency, unspecified: Secondary | ICD-10-CM | POA: Diagnosis not present

## 2021-02-24 DIAGNOSIS — Z0000 Encounter for general adult medical examination without abnormal findings: Secondary | ICD-10-CM | POA: Diagnosis not present

## 2021-02-24 DIAGNOSIS — E785 Hyperlipidemia, unspecified: Secondary | ICD-10-CM | POA: Diagnosis not present

## 2021-02-24 DIAGNOSIS — I10 Essential (primary) hypertension: Secondary | ICD-10-CM | POA: Diagnosis not present

## 2021-03-14 DIAGNOSIS — M25571 Pain in right ankle and joints of right foot: Secondary | ICD-10-CM | POA: Diagnosis not present

## 2021-04-11 DIAGNOSIS — M25571 Pain in right ankle and joints of right foot: Secondary | ICD-10-CM | POA: Diagnosis not present

## 2021-05-04 ENCOUNTER — Other Ambulatory Visit: Payer: Self-pay | Admitting: Family Medicine

## 2021-05-04 DIAGNOSIS — Z1231 Encounter for screening mammogram for malignant neoplasm of breast: Secondary | ICD-10-CM

## 2021-05-17 DIAGNOSIS — M25571 Pain in right ankle and joints of right foot: Secondary | ICD-10-CM | POA: Diagnosis not present

## 2021-05-18 DIAGNOSIS — Z23 Encounter for immunization: Secondary | ICD-10-CM | POA: Diagnosis not present

## 2021-06-07 DIAGNOSIS — E668 Other obesity: Secondary | ICD-10-CM | POA: Diagnosis not present

## 2021-06-07 DIAGNOSIS — F330 Major depressive disorder, recurrent, mild: Secondary | ICD-10-CM | POA: Diagnosis not present

## 2021-06-07 DIAGNOSIS — B009 Herpesviral infection, unspecified: Secondary | ICD-10-CM | POA: Diagnosis not present

## 2021-06-07 DIAGNOSIS — I10 Essential (primary) hypertension: Secondary | ICD-10-CM | POA: Diagnosis not present

## 2021-06-10 ENCOUNTER — Other Ambulatory Visit: Payer: Self-pay

## 2021-06-10 ENCOUNTER — Ambulatory Visit
Admission: RE | Admit: 2021-06-10 | Discharge: 2021-06-10 | Disposition: A | Discharge status: Home / Self Care | Payer: Medicare PPO | Source: Ambulatory Visit | Attending: Family Medicine | Admitting: Family Medicine

## 2021-06-10 DIAGNOSIS — Z1231 Encounter for screening mammogram for malignant neoplasm of breast: Secondary | ICD-10-CM

## 2021-06-17 DIAGNOSIS — M25561 Pain in right knee: Secondary | ICD-10-CM | POA: Diagnosis not present

## 2021-06-21 DIAGNOSIS — Z08 Encounter for follow-up examination after completed treatment for malignant neoplasm: Secondary | ICD-10-CM | POA: Diagnosis not present

## 2021-06-21 DIAGNOSIS — C4372 Malignant melanoma of left lower limb, including hip: Secondary | ICD-10-CM | POA: Diagnosis not present

## 2021-06-21 DIAGNOSIS — Z85820 Personal history of malignant melanoma of skin: Secondary | ICD-10-CM | POA: Diagnosis not present

## 2021-06-21 DIAGNOSIS — L648 Other androgenic alopecia: Secondary | ICD-10-CM | POA: Diagnosis not present

## 2021-06-21 DIAGNOSIS — D2371 Other benign neoplasm of skin of right lower limb, including hip: Secondary | ICD-10-CM | POA: Diagnosis not present

## 2021-06-21 DIAGNOSIS — L814 Other melanin hyperpigmentation: Secondary | ICD-10-CM | POA: Diagnosis not present

## 2021-06-21 DIAGNOSIS — L659 Nonscarring hair loss, unspecified: Secondary | ICD-10-CM | POA: Diagnosis not present

## 2021-06-21 DIAGNOSIS — L821 Other seborrheic keratosis: Secondary | ICD-10-CM | POA: Diagnosis not present

## 2021-09-05 DIAGNOSIS — E668 Other obesity: Secondary | ICD-10-CM | POA: Diagnosis not present

## 2021-09-05 DIAGNOSIS — I10 Essential (primary) hypertension: Secondary | ICD-10-CM | POA: Diagnosis not present

## 2021-09-19 ENCOUNTER — Other Ambulatory Visit: Payer: Self-pay | Admitting: Family Medicine

## 2021-09-19 DIAGNOSIS — E2839 Other primary ovarian failure: Secondary | ICD-10-CM

## 2021-12-12 DIAGNOSIS — I10 Essential (primary) hypertension: Secondary | ICD-10-CM | POA: Diagnosis not present

## 2021-12-15 DIAGNOSIS — Z961 Presence of intraocular lens: Secondary | ICD-10-CM | POA: Diagnosis not present

## 2021-12-15 DIAGNOSIS — H35363 Drusen (degenerative) of macula, bilateral: Secondary | ICD-10-CM | POA: Diagnosis not present

## 2022-01-10 DIAGNOSIS — M25562 Pain in left knee: Secondary | ICD-10-CM | POA: Diagnosis not present

## 2022-01-11 ENCOUNTER — Other Ambulatory Visit: Payer: Self-pay | Admitting: Sports Medicine

## 2022-01-11 ENCOUNTER — Ambulatory Visit
Admission: RE | Admit: 2022-01-11 | Discharge: 2022-01-11 | Disposition: A | Discharge status: Home / Self Care | Payer: Medicare PPO | Source: Ambulatory Visit | Attending: Sports Medicine | Admitting: Sports Medicine

## 2022-01-11 DIAGNOSIS — M25562 Pain in left knee: Secondary | ICD-10-CM

## 2022-01-24 ENCOUNTER — Other Ambulatory Visit: Payer: Medicare PPO

## 2022-02-06 DIAGNOSIS — M85851 Other specified disorders of bone density and structure, right thigh: Secondary | ICD-10-CM | POA: Diagnosis not present

## 2022-02-06 DIAGNOSIS — M85852 Other specified disorders of bone density and structure, left thigh: Secondary | ICD-10-CM | POA: Diagnosis not present

## 2022-02-06 DIAGNOSIS — Z780 Asymptomatic menopausal state: Secondary | ICD-10-CM | POA: Diagnosis not present

## 2022-03-03 DIAGNOSIS — E663 Overweight: Secondary | ICD-10-CM | POA: Diagnosis not present

## 2022-03-03 DIAGNOSIS — I7300 Raynaud's syndrome without gangrene: Secondary | ICD-10-CM | POA: Diagnosis not present

## 2022-03-03 DIAGNOSIS — L930 Discoid lupus erythematosus: Secondary | ICD-10-CM | POA: Diagnosis not present

## 2022-03-03 DIAGNOSIS — M329 Systemic lupus erythematosus, unspecified: Secondary | ICD-10-CM | POA: Diagnosis not present

## 2022-03-03 DIAGNOSIS — Z6828 Body mass index (BMI) 28.0-28.9, adult: Secondary | ICD-10-CM | POA: Diagnosis not present

## 2022-03-20 DIAGNOSIS — D2262 Melanocytic nevi of left upper limb, including shoulder: Secondary | ICD-10-CM | POA: Diagnosis not present

## 2022-03-20 DIAGNOSIS — L821 Other seborrheic keratosis: Secondary | ICD-10-CM | POA: Diagnosis not present

## 2022-03-20 DIAGNOSIS — D2272 Melanocytic nevi of left lower limb, including hip: Secondary | ICD-10-CM | POA: Diagnosis not present

## 2022-03-20 DIAGNOSIS — D2261 Melanocytic nevi of right upper limb, including shoulder: Secondary | ICD-10-CM | POA: Diagnosis not present

## 2022-03-20 DIAGNOSIS — Z85820 Personal history of malignant melanoma of skin: Secondary | ICD-10-CM | POA: Diagnosis not present

## 2022-04-04 ENCOUNTER — Other Ambulatory Visit: Payer: Medicare PPO

## 2022-05-11 ENCOUNTER — Other Ambulatory Visit: Payer: Self-pay | Admitting: Family Medicine

## 2022-05-11 DIAGNOSIS — Z23 Encounter for immunization: Secondary | ICD-10-CM | POA: Diagnosis not present

## 2022-05-11 DIAGNOSIS — Z1231 Encounter for screening mammogram for malignant neoplasm of breast: Secondary | ICD-10-CM

## 2022-06-12 ENCOUNTER — Ambulatory Visit
Admission: RE | Admit: 2022-06-12 | Discharge: 2022-06-12 | Disposition: A | Discharge status: Home / Self Care | Payer: Medicare PPO | Source: Ambulatory Visit | Attending: Family Medicine | Admitting: Family Medicine

## 2022-06-12 DIAGNOSIS — Z1231 Encounter for screening mammogram for malignant neoplasm of breast: Secondary | ICD-10-CM

## 2022-07-05 DIAGNOSIS — I10 Essential (primary) hypertension: Secondary | ICD-10-CM | POA: Diagnosis not present

## 2022-07-05 DIAGNOSIS — J302 Other seasonal allergic rhinitis: Secondary | ICD-10-CM | POA: Diagnosis not present

## 2022-07-05 DIAGNOSIS — Z131 Encounter for screening for diabetes mellitus: Secondary | ICD-10-CM | POA: Diagnosis not present

## 2022-07-05 DIAGNOSIS — M8580 Other specified disorders of bone density and structure, unspecified site: Secondary | ICD-10-CM | POA: Diagnosis not present

## 2022-07-05 DIAGNOSIS — E785 Hyperlipidemia, unspecified: Secondary | ICD-10-CM | POA: Diagnosis not present

## 2022-07-05 DIAGNOSIS — Z8249 Family history of ischemic heart disease and other diseases of the circulatory system: Secondary | ICD-10-CM | POA: Diagnosis not present

## 2022-07-05 DIAGNOSIS — Z1159 Encounter for screening for other viral diseases: Secondary | ICD-10-CM | POA: Diagnosis not present

## 2022-07-05 DIAGNOSIS — Z0000 Encounter for general adult medical examination without abnormal findings: Secondary | ICD-10-CM | POA: Diagnosis not present

## 2022-07-05 DIAGNOSIS — M8588 Other specified disorders of bone density and structure, other site: Secondary | ICD-10-CM | POA: Diagnosis not present

## 2022-07-05 DIAGNOSIS — F419 Anxiety disorder, unspecified: Secondary | ICD-10-CM | POA: Diagnosis not present

## 2022-07-05 DIAGNOSIS — Z79899 Other long term (current) drug therapy: Secondary | ICD-10-CM | POA: Diagnosis not present

## 2023-03-23 DIAGNOSIS — B009 Herpesviral infection, unspecified: Secondary | ICD-10-CM | POA: Diagnosis not present

## 2023-03-23 DIAGNOSIS — R5383 Other fatigue: Secondary | ICD-10-CM | POA: Diagnosis not present

## 2023-03-23 DIAGNOSIS — F411 Generalized anxiety disorder: Secondary | ICD-10-CM | POA: Diagnosis not present

## 2023-03-23 DIAGNOSIS — F330 Major depressive disorder, recurrent, mild: Secondary | ICD-10-CM | POA: Diagnosis not present

## 2023-03-23 DIAGNOSIS — Z8249 Family history of ischemic heart disease and other diseases of the circulatory system: Secondary | ICD-10-CM | POA: Diagnosis not present

## 2023-05-02 DIAGNOSIS — Z23 Encounter for immunization: Secondary | ICD-10-CM | POA: Diagnosis not present

## 2023-05-10 ENCOUNTER — Encounter: Payer: Self-pay | Admitting: Podiatry

## 2023-05-10 ENCOUNTER — Ambulatory Visit (INDEPENDENT_AMBULATORY_CARE_PROVIDER_SITE_OTHER): Payer: Medicare Other | Admitting: Podiatry

## 2023-05-10 DIAGNOSIS — M7989 Other specified soft tissue disorders: Secondary | ICD-10-CM | POA: Diagnosis not present

## 2023-05-10 DIAGNOSIS — I7300 Raynaud's syndrome without gangrene: Secondary | ICD-10-CM | POA: Insufficient documentation

## 2023-05-10 DIAGNOSIS — L930 Discoid lupus erythematosus: Secondary | ICD-10-CM | POA: Insufficient documentation

## 2023-05-10 DIAGNOSIS — M778 Other enthesopathies, not elsewhere classified: Secondary | ICD-10-CM

## 2023-05-10 DIAGNOSIS — M329 Systemic lupus erythematosus, unspecified: Secondary | ICD-10-CM | POA: Insufficient documentation

## 2023-05-13 NOTE — Progress Notes (Signed)
Subjective:  Patient ID: Stacey Hull, female    DOB: 06-18-1946,  MRN: [id number] HPI Chief Complaint  Patient presents with   Ankle Pain    Lateral ankle bilateral - patient states it seems like there is a pocket at the ankle that is filling up with fluid, not painful, but seems to be getting bigger, just concerned   New Patient (Initial Visit)    77 y.o. female presents with the above complaint.   ROS: Denies fever chills nausea vomit muscle aches pains calf back pain chest pain shortness of breath.  No past medical history on file. Past Surgical History:  Procedure Laterality Date   BREAST EXCISIONAL BIOPSY Right 1980s   benign    Current Outpatient Medications:    ALPRAZolam (XANAX) 0.25 MG tablet, 1 tablet Orally once daily as needed for severe anxiety (fill every 30 days) for 30 days, Disp: , Rfl:    CONTRAVE 8-90 MG TB12, , Disp: , Rfl:    escitalopram (LEXAPRO) 10 MG tablet, Take by mouth., Disp: , Rfl:    FLUoxetine (PROZAC) 10 MG capsule, Take 10 mg by mouth daily., Disp: , Rfl:    valsartan-hydrochlorothiazide (DIOVAN-HCT) 160-12.5 MG per tablet, Take 1 tablet by mouth daily., Disp: , Rfl:   No Known Allergies Review of Systems Objective:  There were no vitals filed for this visit.  General: Well developed, nourished, in no acute distress, alert and oriented x3   Dermatological: Skin is warm, dry and supple bilateral. Nails x 10 are well maintained; remaining integument appears unremarkable at this time. There are no open sores, no preulcerative lesions, no rash or signs of infection present.  She has a large soft tissue masses to the anterior lateral ankle overlying the sinus tarsi with pain on palpation and ambulation these are firm nonpulsatile masses which with dorsiflexion of the foot are impinged in the sinus tarsi.  They appear to have been irritated plantarly with shoe gear.  Vascular: Dorsalis Pedis artery and Posterior Tibial artery pedal pulses are  2/4 bilateral with immedate capillary fill time. Pedal hair growth present. No varicosities and no lower extremity edema present bilateral.   Neruologic: Grossly intact via light touch bilateral. Vibratory intact via tuning fork bilateral. Protective threshold with Semmes Wienstein monofilament intact to all pedal sites bilateral. Patellar and Achilles deep tendon reflexes 2+ bilateral. No Babinski or clonus noted bilateral.   Musculoskeletal: No gross boney pedal deformities bilateral. No pain, crepitus, or limitation noted with foot and ankle range of motion bilateral. Muscular strength 5/5 in all groups tested bilateral.  Gait: Unassisted, antalgic   Radiographs:  None taken  Assessment & Plan:   Assessment: Soft tissue tumor bilateral anterior ankles painful on palpation and ambulation  Plan: Discussed surgical intervention with excision of tumor to the bilateral ankle we discussed this in great detail today she understands the possible side effects and complications associated with this we will consent her for excision of soft tissue tumors with local anesthetic and Darco shoes she will have this done outpatient setting follow-up with her in the near future.     Linton Stolp T. [place], [place]

## 2023-05-14 ENCOUNTER — Other Ambulatory Visit: Payer: Self-pay | Admitting: Family Medicine

## 2023-05-14 DIAGNOSIS — Z1231 Encounter for screening mammogram for malignant neoplasm of breast: Secondary | ICD-10-CM

## 2023-05-24 DIAGNOSIS — W57XXXA Bitten or stung by nonvenomous insect and other nonvenomous arthropods, initial encounter: Secondary | ICD-10-CM | POA: Diagnosis not present

## 2023-05-24 DIAGNOSIS — S40861A Insect bite (nonvenomous) of right upper arm, initial encounter: Secondary | ICD-10-CM | POA: Diagnosis not present

## 2023-05-24 DIAGNOSIS — S40862A Insect bite (nonvenomous) of left upper arm, initial encounter: Secondary | ICD-10-CM | POA: Diagnosis not present

## 2023-06-14 ENCOUNTER — Ambulatory Visit
Admission: RE | Admit: 2023-06-14 | Discharge: 2023-06-14 | Disposition: A | Discharge status: Home / Self Care | Payer: Medicare PPO | Source: Ambulatory Visit | Attending: Family Medicine | Admitting: Family Medicine

## 2023-06-14 DIAGNOSIS — Z1231 Encounter for screening mammogram for malignant neoplasm of breast: Secondary | ICD-10-CM | POA: Diagnosis not present

## 2023-06-21 DIAGNOSIS — H109 Unspecified conjunctivitis: Secondary | ICD-10-CM | POA: Diagnosis not present

## 2023-07-13 DIAGNOSIS — Z0000 Encounter for general adult medical examination without abnormal findings: Secondary | ICD-10-CM | POA: Diagnosis not present

## 2023-07-13 DIAGNOSIS — Z79899 Other long term (current) drug therapy: Secondary | ICD-10-CM | POA: Diagnosis not present

## 2023-07-13 DIAGNOSIS — I10 Essential (primary) hypertension: Secondary | ICD-10-CM | POA: Diagnosis not present

## 2023-07-13 DIAGNOSIS — M8588 Other specified disorders of bone density and structure, other site: Secondary | ICD-10-CM | POA: Diagnosis not present

## 2023-07-13 DIAGNOSIS — E785 Hyperlipidemia, unspecified: Secondary | ICD-10-CM | POA: Diagnosis not present

## 2023-07-13 DIAGNOSIS — Z23 Encounter for immunization: Secondary | ICD-10-CM | POA: Diagnosis not present

## 2023-07-13 DIAGNOSIS — M8580 Other specified disorders of bone density and structure, unspecified site: Secondary | ICD-10-CM | POA: Diagnosis not present

## 2023-08-09 ENCOUNTER — Telehealth: Payer: Self-pay | Admitting: Podiatry

## 2023-08-09 NOTE — Telephone Encounter (Signed)
DOS-09/07/23  EXC. GANGLION/ TUMOR BILAR-[id number]  [employer] EFFECTIVE DATE-08/29/19  OOP-$4000.00 WITH REMAINING $3,855.44  COINSURANCE-0%  PER THE [place] WEBSITE PORTAL, PRIOR AUTH IS NOT REQUIRED FOR CPT CODE 95621. GOOD 09/07/23 - 12/06/23  AUTH Tracking #[id number]

## 2023-09-05 ENCOUNTER — Telehealth: Payer: Self-pay | Admitting: Urology

## 2023-09-05 NOTE — Telephone Encounter (Signed)
 Called and LM for pt that we need to reschedule her sx on 09/07/23 with Dr. Al Corpus.

## 2023-09-18 ENCOUNTER — Encounter: Payer: Medicare PPO | Admitting: Podiatry

## 2023-10-02 ENCOUNTER — Encounter: Payer: Medicare PPO | Admitting: Podiatry

## 2023-10-03 ENCOUNTER — Other Ambulatory Visit: Payer: Self-pay | Admitting: Podiatry

## 2023-10-03 MED ORDER — ONDANSETRON HCL 4 MG PO TABS: 4.0000 mg | ORAL_TABLET | Freq: Three times a day (TID) | ORAL | 0 refills | Status: DC | PRN

## 2023-10-03 MED ORDER — CEPHALEXIN 500 MG PO CAPS: 500.0000 mg | ORAL_CAPSULE | Freq: Three times a day (TID) | ORAL | 0 refills | Status: DC

## 2023-10-03 MED ORDER — HYDROCODONE-ACETAMINOPHEN 10-325 MG PO TABS: 1.0000 | ORAL_TABLET | Freq: Four times a day (QID) | ORAL | 0 refills | Status: AC | PRN

## 2023-10-05 DIAGNOSIS — M799 Soft tissue disorder, unspecified: Secondary | ICD-10-CM | POA: Diagnosis not present

## 2023-10-05 DIAGNOSIS — M7989 Other specified soft tissue disorders: Secondary | ICD-10-CM | POA: Diagnosis not present

## 2023-10-05 DIAGNOSIS — D1723 Benign lipomatous neoplasm of skin and subcutaneous tissue of right leg: Secondary | ICD-10-CM | POA: Diagnosis not present

## 2023-10-05 DIAGNOSIS — D1739 Benign lipomatous neoplasm of skin and subcutaneous tissue of other sites: Secondary | ICD-10-CM | POA: Diagnosis not present

## 2023-10-05 DIAGNOSIS — D1724 Benign lipomatous neoplasm of skin and subcutaneous tissue of left leg: Secondary | ICD-10-CM | POA: Diagnosis not present

## 2023-10-11 ENCOUNTER — Encounter: Payer: Self-pay | Admitting: Podiatry

## 2023-10-11 ENCOUNTER — Encounter: Payer: Medicare PPO | Admitting: Podiatry

## 2023-10-11 ENCOUNTER — Ambulatory Visit (INDEPENDENT_AMBULATORY_CARE_PROVIDER_SITE_OTHER): Payer: Medicare PPO | Admitting: Podiatry

## 2023-10-11 VITALS — Ht 62.0 in

## 2023-10-11 DIAGNOSIS — M7989 Other specified soft tissue disorders: Secondary | ICD-10-CM

## 2023-10-11 DIAGNOSIS — Z9889 Other specified postprocedural states: Secondary | ICD-10-CM

## 2023-10-11 MED ORDER — HYDROCODONE-ACETAMINOPHEN 10-325 MG PO TABS: 1.0000 | ORAL_TABLET | Freq: Three times a day (TID) | ORAL | 0 refills | Status: DC | PRN

## 2023-10-11 NOTE — Progress Notes (Signed)
  Subjective:  Patient ID: Stacey Hull, female    DOB: 05-31-1946,  MRN: [id number]  Chief Complaint  Patient presents with   Routine Post Op    POV #1 DOS 10/05/2023 EXCISION SOFT TISSUE TUMORS ANTERIOR LATERAL ANKLES B/L - HYATT PT    DOS: 10/05/2023 Procedure: Excision of soft tissue tumor to anterior lateral ankle bilateral, with Dr. Al Corpus  78 y.o. female returns for post-op check.  She has been doing well.  States that pain has been well-controlled overall with medication.  Review of Systems: Negative except as noted in the HPI. Denies N/V/F/Ch.  History reviewed. No pertinent past medical history.  Current Outpatient Medications:    ALPRAZolam (XANAX) 0.25 MG tablet, 1 tablet Orally once daily as needed for severe anxiety (fill every 30 days) for 30 days, Disp: , Rfl:    cephALEXin (KEFLEX) 500 MG capsule, Take 1 capsule (500 mg total) by mouth 3 (three) times daily., Disp: 30 capsule, Rfl: 0   CONTRAVE 8-90 MG TB12, , Disp: , Rfl:    escitalopram (LEXAPRO) 10 MG tablet, Take by mouth., Disp: , Rfl:    FLUoxetine (PROZAC) 10 MG capsule, Take 10 mg by mouth daily., Disp: , Rfl:    HYDROcodone-acetaminophen (NORCO) 10-325 MG tablet, Take 1 tablet by mouth every 8 (eight) hours as needed for up to 20 doses., Disp: 20 tablet, Rfl: 0   ondansetron (ZOFRAN) 4 MG tablet, Take 1 tablet (4 mg total) by mouth every 8 (eight) hours as needed., Disp: 20 tablet, Rfl: 0   valsartan-hydrochlorothiazide (DIOVAN-HCT) 160-12.5 MG per tablet, Take 1 tablet by mouth daily., Disp: , Rfl:   Social History   Tobacco Use  Smoking Status Never  Smokeless Tobacco Never    No Known Allergies Objective:  There were no vitals filed for this visit. Body mass index is 30.91 kg/m. Constitutional Well developed. Well nourished.  Vascular Foot warm and well perfused. Capillary refill normal to all digits.   Neurologic Normal speech. Oriented to person, place, and time. Epicritic sensation to  light touch grossly present bilaterally.  Dermatologic Skin healing well without signs of infection. Skin edges well coapted without signs of infection. Sutures intact to bilateral anterior lateral ankles.  Orthopedic: Tenderness to palpation noted about the surgical site.    Assessment:   1. Status post foot surgery   2. Mass of soft tissue of ankle    Plan:  Patient was evaluated and treated and all questions answered.  S/p foot surgery bilaterally excision of soft tissue mass to the bilateral anterior lateral ankle -Progressing as expected post-operatively. -XR: deferred -WB Status: Weightbearing as tolerated in surgical shoes -Sutures: Left intact. -Medications: Refill sent of Norco 10-325 mg can be taken every 8 hours as needed for pain -Foot redressed.  Has 2-week follow-up on file with Dr. Al Corpus

## 2023-10-14 ENCOUNTER — Encounter: Payer: Self-pay | Admitting: Podiatry

## 2023-10-23 ENCOUNTER — Encounter: Payer: Medicare PPO | Admitting: Podiatry

## 2023-10-25 ENCOUNTER — Encounter: Payer: Self-pay | Admitting: Podiatry

## 2023-10-25 ENCOUNTER — Ambulatory Visit (INDEPENDENT_AMBULATORY_CARE_PROVIDER_SITE_OTHER): Payer: Medicare PPO | Admitting: Podiatry

## 2023-10-25 DIAGNOSIS — Z9889 Other specified postprocedural states: Secondary | ICD-10-CM

## 2023-10-25 DIAGNOSIS — M7989 Other specified soft tissue disorders: Secondary | ICD-10-CM

## 2023-10-25 NOTE — Progress Notes (Signed)
 She presents today for her second postop visit she is status post excision soft tissue tumors anterolateral ankle.  Date of surgery October 05, 2023.  She states that she is tired of sitting around and is ready to get moving.  Objective: Vital signs are stable alert oriented x 3.  Pulses are palpable.  There is no erythema edema cellulitis drainage or odor incision sites have gone on to heal uneventfully she has some swelling just in the pocket that was created on the right side versus the left.  But minimal pain.  Assessment: Well-healing surgical foot bilateral.  Plan: Put her compression anklet today after removing the suture ends.  Allow her to start washing this wearing regular shoes follow-up with me in 2 weeks

## 2023-11-15 ENCOUNTER — Encounter: Payer: Self-pay | Admitting: Podiatry

## 2023-11-15 ENCOUNTER — Ambulatory Visit (INDEPENDENT_AMBULATORY_CARE_PROVIDER_SITE_OTHER): Payer: Medicare PPO | Admitting: Podiatry

## 2023-11-15 DIAGNOSIS — Z9889 Other specified postprocedural states: Secondary | ICD-10-CM

## 2023-11-15 DIAGNOSIS — M7989 Other specified soft tissue disorders: Secondary | ICD-10-CM

## 2023-11-15 NOTE — Progress Notes (Signed)
 She presents today for her final postop visit excision soft tissue masses anterior lateral ankles.  She denies fever chills nausea vomiting states they are doing just fine and they feel perfect.  Objective: Vital signs are stable she is alert and oriented x 3 she has some scar tissue that has bound down around the incision site but otherwise doing well.  Assessment: Well-healing surgical foot bilateral.  Plan: Continue to use compression anklet with any swelling.  Massage around the incision sites which should go on to loosen up.  Did recommend that she watch tanning because this area will probably burns in the next couple of months.  Recommend she use SPF.

## 2023-12-20 DIAGNOSIS — H35363 Drusen (degenerative) of macula, bilateral: Secondary | ICD-10-CM | POA: Diagnosis not present

## 2024-01-09 DIAGNOSIS — E663 Overweight: Secondary | ICD-10-CM | POA: Diagnosis not present

## 2024-01-09 DIAGNOSIS — F418 Other specified anxiety disorders: Secondary | ICD-10-CM | POA: Diagnosis not present

## 2024-05-28 ENCOUNTER — Other Ambulatory Visit: Payer: Self-pay | Admitting: Family Medicine

## 2024-05-28 DIAGNOSIS — Z0000 Encounter for general adult medical examination without abnormal findings: Secondary | ICD-10-CM

## 2024-06-16 ENCOUNTER — Ambulatory Visit
Admission: RE | Admit: 2024-06-16 | Discharge: 2024-06-16 | Disposition: A | Source: Ambulatory Visit | Attending: Family Medicine | Admitting: Family Medicine

## 2024-06-16 DIAGNOSIS — Z0000 Encounter for general adult medical examination without abnormal findings: Secondary | ICD-10-CM

## 2024-06-17 ENCOUNTER — Ambulatory Visit: Admitting: Podiatry

## 2024-06-17 ENCOUNTER — Ambulatory Visit (INDEPENDENT_AMBULATORY_CARE_PROVIDER_SITE_OTHER)

## 2024-06-17 DIAGNOSIS — M65979 Unspecified synovitis and tenosynovitis, unspecified ankle and foot: Secondary | ICD-10-CM | POA: Diagnosis not present

## 2024-06-17 DIAGNOSIS — M76821 Posterior tibial tendinitis, right leg: Secondary | ICD-10-CM

## 2024-06-17 MED ORDER — METHYLPREDNISOLONE 4 MG PO TBPK: ORAL_TABLET | ORAL | 0 refills | Status: AC

## 2024-06-17 MED ORDER — DEXAMETHASONE SODIUM PHOSPHATE 120 MG/30ML IJ SOLN
2.0000 mg | Freq: Once | INTRAMUSCULAR | Status: AC
  Administered 2024-06-17: 2 mg via INTRA_ARTICULAR

## 2024-06-17 NOTE — Progress Notes (Signed)
 She presents today complaining of pain to her right ankle.  States that occasionally she will have pain in her right ankle and she is not able to stand on it.  Is been happening more recent.  She was seen by another doctor in the past who put her on a nitroglycerin patch which helped.  She states that it hurts right here as she points to the medial malleolar area anteriorly as well as posteriorly.  Objective: Vital signs are stable alert and oriented x 3.  Pulses are palpable.  She has some tenderness on palpation of the medial malleolus as well as tenderness on palpation just anterior to the medial malleolus and posterior to the medial malleolus the majority of the fluid on palpation is located inferior to the medial malleolus around the posterior tibial tendon as it courses to the navicular tuberosity.  Radiographs of the ankle and foot taken today do not demonstrate any type of osseous abnormalities in this area there is no osteoarthritic change in this area no fractures identified no acute findings.  Assessment: Tibialis anterior tendinitis posterior tibial tendinitis and possible bony edema medial malleolar region right foot.  Plan: I injected the area today with dexamethasone and local anesthetic to the point of maximal tenderness right at the posterior tibial tendon and the tibialis anterior tendon.  I also started her on methylprednisolone only.  Follow-up with her in 4 weeks if necessary.

## 2024-07-17 ENCOUNTER — Ambulatory Visit: Admitting: Podiatry

## 2024-07-18 ENCOUNTER — Other Ambulatory Visit (HOSPITAL_COMMUNITY): Payer: Self-pay | Admitting: *Deleted

## 2024-07-18 DIAGNOSIS — E7800 Pure hypercholesterolemia, unspecified: Secondary | ICD-10-CM

## 2024-08-06 ENCOUNTER — Ambulatory Visit (HOSPITAL_BASED_OUTPATIENT_CLINIC_OR_DEPARTMENT_OTHER)
Admission: RE | Admit: 2024-08-06 | Discharge: 2024-08-06 | Disposition: A | Discharge status: Home / Self Care | Source: Ambulatory Visit | Attending: Family Medicine | Admitting: Family Medicine

## 2024-08-06 DIAGNOSIS — E7800 Pure hypercholesterolemia, unspecified: Secondary | ICD-10-CM | POA: Insufficient documentation
